# Patient Record
Sex: Female | Born: 1990 | Race: Black or African American | Hispanic: No | Marital: Single | Smoking: Never smoker
Health system: Southern US, Community
[De-identification: ages and names within clinical notes are randomized; demographics above are authoritative.]

## PROBLEM LIST (undated history)

## (undated) DIAGNOSIS — Z789 Other specified health status: Secondary | ICD-10-CM

## (undated) DIAGNOSIS — O139 Gestational [pregnancy-induced] hypertension without significant proteinuria, unspecified trimester: Secondary | ICD-10-CM

## (undated) DIAGNOSIS — R7303 Prediabetes: Secondary | ICD-10-CM

## (undated) DIAGNOSIS — M25569 Pain in unspecified knee: Secondary | ICD-10-CM

## (undated) HISTORY — PX: POLYPECTOMY: SHX149

## (undated) HISTORY — DX: Pain in unspecified knee: M25.569

---

## 2019-04-17 ENCOUNTER — Inpatient Hospital Stay (HOSPITAL_COMMUNITY): Payer: 59

## 2019-04-17 ENCOUNTER — Inpatient Hospital Stay (HOSPITAL_COMMUNITY)
Admission: EM | Admit: 2019-04-17 | Discharge: 2019-04-18 | Disposition: A | Discharge status: Home / Self Care | Payer: 59 | Attending: Obstetrics and Gynecology | Admitting: Obstetrics and Gynecology

## 2019-04-17 ENCOUNTER — Other Ambulatory Visit: Payer: Self-pay

## 2019-04-17 ENCOUNTER — Encounter (HOSPITAL_COMMUNITY): Payer: Self-pay | Admitting: Emergency Medicine

## 2019-04-17 DIAGNOSIS — O209 Hemorrhage in early pregnancy, unspecified: Secondary | ICD-10-CM | POA: Diagnosis present

## 2019-04-17 DIAGNOSIS — O039 Complete or unspecified spontaneous abortion without complication: Secondary | ICD-10-CM | POA: Insufficient documentation

## 2019-04-17 DIAGNOSIS — Z3A01 Less than 8 weeks gestation of pregnancy: Secondary | ICD-10-CM | POA: Diagnosis not present

## 2019-04-17 LAB — POC URINE PREG, ED: Preg Test, Ur: POSITIVE — AB

## 2019-04-17 LAB — ABO/RH: ABO/RH(D): B POS

## 2019-04-17 NOTE — ED Notes (Signed)
Pt is approx [redacted] weeks pregnant, EDD 12/04/2018. Reports vaginal bleeding.

## 2019-04-17 NOTE — ED Notes (Signed)
Pt in POV

## 2019-04-17 NOTE — MAU Provider Note (Signed)
Chief Complaint: Abdominal Pain and Vaginal Bleeding   First Provider Initiated Contact with Patient 04/17/19 2208        SUBJECTIVE HPI: Brandi Wise is a 28 y.o. G2P0010 at [redacted]w[redacted]d by LMP who presents to maternity admissions reporting vaginal bleeding since Friday.  Told nurse she thinks it is a miscarriage. States had one pad halfway saturated, but no clots.  Only a little cramping.  She denies vaginal itching/burning, urinary symptoms, h/a, dizziness, n/v, or fever/chills.   States gets care in [place] because she used to live there.  Had an [place] yesterday and showed me the video on her phone.  There was an IUGS with [redacted]w[redacted]d fetus with a heartbeat.  I cannot verify date or subject for sure but she states it was her baby yesterday. She did not call them tonight "because they are closed".  Has a history of another SAB which never progressed to a fetal heartbeat.    RN Note: Pt states that she thinks she is having a miscarriage.  Pt states she had a positive pregnancy test at home on 03/27/2019.  Pt reports vaginal bleeding that started last Friday with some spotting  History reviewed. No pertinent past medical history. History reviewed. No pertinent surgical history. Social History   Socioeconomic History  . Marital status: Single    Spouse name: Not on file  . Number of children: Not on file  . Years of education: Not on file  . Highest education level: Not on file  Occupational History  . Not on file  Social Needs  . Financial resource strain: Not on file  . Food insecurity:    Worry: Not on file    Inability: Not on file  . Transportation needs:    Medical: Not on file    Non-medical: Not on file  Tobacco Use  . Smoking status: Never Smoker  . Smokeless tobacco: Never Used  Substance and Sexual Activity  . Alcohol use: Never    Frequency: Never  . Drug use: Never  . Sexual activity: Not Currently  Lifestyle  . Physical activity:    Days per week: Not on file     Minutes per session: Not on file  . Stress: Not on file  Relationships  . Social connections:    Talks on phone: Not on file    Gets together: Not on file    Attends religious service: Not on file    Active member of club or organization: Not on file    Attends meetings of clubs or organizations: Not on file    Relationship status: Not on file  . Intimate partner violence:    Fear of current or ex partner: Not on file    Emotionally abused: Not on file    Physically abused: Not on file    Forced sexual activity: Not on file  Other Topics Concern  . Not on file  Social History Narrative  . Not on file   No current facility-administered medications on file prior to encounter.    No current outpatient medications on file prior to encounter.   No Known Allergies  I have reviewed patient's Past Medical Hx, Surgical Hx, Family Hx, Social Hx, medications and allergies.   ROS:  Review of Systems  Constitutional: Negative for chills and fever.  Respiratory: Negative for shortness of breath.   Gastrointestinal: Negative for abdominal pain, constipation, diarrhea and nausea.  Genitourinary: Positive for pelvic pain (mild cramping) and vaginal bleeding (small ).  Review of Systems  Other systems negative   Physical Exam  Physical Exam Patient Vitals for the past 24 hrs:  BP Temp Temp src Pulse Resp SpO2 Height Weight  04/17/19 2157 131/79 98.4 F (36.9 C) Oral 91 16 96 % - -  04/17/19 2153 - - - - - - - 102.7 kg  04/17/19 2102 (!) 137/92 98.6 F (37 C) Oral 97 16 100 % 5\' 5"  (1.651 m) 99.8 kg   Constitutional: Well-developed, well-nourished female in no acute distress.  Cardiovascular: normal rate Respiratory: normal effort GI: Abd soft, non-tender. Pos BS x 4 MS: Extremities nontender, no edema, normal ROM Neurologic: Alert and oriented x 4.  GU: Neg CVAT.  PELVIC EXAM: Cervix pink, visually closed, without lesion, small clotted old blood near cervix, which is closed  and long  Vaginal walls and external genitalia normal Bimanual exam: Cervix 0/long/high, firm, anterior, neg CMT, uterus nontender, nonenlarged, adnexa without tenderness, enlargement, or mass   LAB RESULTS Results for orders placed or performed during the hospital encounter of 04/17/19 (from the past 24 hour(s))  POC Urine Pregnancy, ED (not at [facility])     Status: Abnormal   Collection Time: 04/17/19  9:22 PM  Result Value Ref Range   Preg Test, Ur POSITIVE (A) NEGATIVE  ABO/Rh     Status: None   Collection Time: 04/17/19 10:46 PM  Result Value Ref Range   ABO/RH(D) B POS    No rh immune globuloin      NOT A RH IMMUNE GLOBULIN CANDIDATE, PT RH POSITIVE Performed at [facility], [address], [place], [place] [address]   CBC     Status: Abnormal   Collection Time: 04/18/19 12:24 AM  Result Value Ref Range   WBC 15.9 (H) 4.0 - 10.5 K/uL   RBC 3.97 3.87 - 5.11 MIL/uL   Hemoglobin 11.6 (L) 12.0 - 15.0 g/dL   HCT 34.5 (L) 36.0 - 46.0 %   MCV 86.9 80.0 - 100.0 fL   MCH 29.2 26.0 - 34.0 pg   MCHC 33.6 30.0 - 36.0 g/dL   RDW 13.3 11.5 - 15.5 %   Platelets 381 150 - 400 K/uL   nRBC 0.0 0.0 - 0.2 %   Quantitative HCG:   2,402    IMAGING US Ob Transvaginal  Result Date: 04/17/2019 CLINICAL DATA:  28 y/o  F; vaginal bleeding today. EXAM: TRANSVAGINAL OB ULTRASOUND TECHNIQUE: Transvaginal ultrasound was performed for complete evaluation of the gestation as well as the maternal uterus, adnexal regions, and pelvic cul-de-sac. COMPARISON:  None. FINDINGS: Intrauterine gestational sac: None Yolk sac:  Not Visualized. Embryo:  Not Visualized. Cardiac Activity: Not Visualized. Maternal uterus/adnexae: The endometrium is diffusely heterogeneous and hyperemic on color Doppler. The right ovary measures 3.2 x 2.3 x 2.4 cm and is morphologically normal. The left ovary measures 3.9 x 2.1 x 1.6 cm. There is a small left ovarian corpus luteum. IMPRESSION: No intrauterine pregnancy identified. The  endometrium is heterogeneous and hyperemic. Findings may represent a recent spontaneous abortion or nonvisualization of the pregnancy. Intrauterine pregnancy may not be identified with a beta HCG of less than 3,000. Follow-up beta HCG and possible pelvic ultrasound as indicated is recommended to evaluate pregnancy location. This recommendation follows SRU consensus guidelines: Diagnostic Criteria for Nonviable Pregnancy Early in the First Trimester. [place] Med 2013; 379:0240-97. Electronically Signed   By: Kristine Garbe M.D.   On: 04/17/2019 23:39     MAU Management/MDM: I initially  ordered an Korea since she had a video of her Korea reportedly from yesterday showing a single live IUP at [redacted]w[redacted]d.   Our Korea did not show any signs of a pregnancy  I added on the HCG which is listed above.  Discussed it is unclear as to what happened. The amount of bleeding and pain she describes is not consistent with loss of a 6+ week pregnancy, but if the Korea she showed me is accurate, this appears to represent a loss  I recommended she call her office this morning and tellthem she was here THey should repeat her HCG in 48 hrs or a week to assess for drop She may need a followup US next week She took this news very stoically and did not have any questions Did not appear surprised or upset.  ASSESSMENT Pregnancy at [redacted]w[redacted]d by LMP Apparent completed spontaneous abortion vs early pregnancy of unknownlocation  PLAN Discharge home She prefers to do her follow up at her doctor in [place] repeat HCG level in 48 hours Bleeding precautions  Pt stable at time of discharge. Encouraged to return here or to other Urgent Care/ED if she develops worsening of symptoms, increase in pain, fever, or other concerning symptoms.    Hansel Feinstein CNM, MSN Certified Nurse-Midwife 04/17/2019  10:08 PM

## 2019-04-17 NOTE — MAU Note (Signed)
Pt states that she thinks she is having a miscarriage.   Pt states she had a positive pregnancy test at home on 03/27/2019.   Pt reports vaginal bleeding that started last Friday with some spotting.

## 2019-04-18 DIAGNOSIS — O039 Complete or unspecified spontaneous abortion without complication: Secondary | ICD-10-CM

## 2019-04-18 DIAGNOSIS — Z3A01 Less than 8 weeks gestation of pregnancy: Secondary | ICD-10-CM

## 2019-04-18 DIAGNOSIS — O209 Hemorrhage in early pregnancy, unspecified: Secondary | ICD-10-CM

## 2019-04-18 LAB — CBC
HCT: 34.5 % — ABNORMAL LOW (ref 36.0–46.0)
Hemoglobin: 11.6 g/dL — ABNORMAL LOW (ref 12.0–15.0)
MCH: 29.2 pg (ref 26.0–34.0)
MCHC: 33.6 g/dL (ref 30.0–36.0)
MCV: 86.9 fL (ref 80.0–100.0)
Platelets: 381 10*3/uL (ref 150–400)
RBC: 3.97 MIL/uL (ref 3.87–5.11)
RDW: 13.3 % (ref 11.5–15.5)
WBC: 15.9 10*3/uL — ABNORMAL HIGH (ref 4.0–10.5)
nRBC: 0 % (ref 0.0–0.2)

## 2019-04-18 LAB — HCG, QUANTITATIVE, PREGNANCY: hCG, Beta Chain, Quant, S: 2402 m[IU]/mL — ABNORMAL HIGH (ref <5)

## 2019-04-18 NOTE — Discharge Instructions (Signed)
Vaginal Bleeding During Pregnancy, First Trimester  A small amount of bleeding (spotting) from the vagina is common during early pregnancy. Sometimes the bleeding is normal and does not cause problems. At other times, though, bleeding may be a sign of something serious. Tell your doctor about any bleeding from your vagina right away. Follow these instructions at home: Activity  Follow your doctor's instructions about how active you can be.  If needed, make plans for someone to help with your normal activities.  Do not have sex or orgasms until your doctor says that this is safe. General instructions  Take over-the-counter and prescription medicines only as told by your doctor.  Watch your condition for any changes.  Write down: ? The number of pads you use each day. ? How often you change pads. ? How soaked (saturated) your pads are.  Do not use tampons.  Do not douche.  If you pass any tissue from your vagina, save it to show to your doctor.  Keep all follow-up visits as told by your doctor. This is important. Contact a doctor if:  You have vaginal bleeding at any time while you are pregnant.  You have cramps.  You have a fever. Get help right away if:  You have very bad cramps in your back or belly (abdomen).  You pass large clots or a lot of tissue from your vagina.  Your bleeding gets worse.  You feel light-headed.  You feel weak.  You pass out (faint).  You have chills.  You are leaking fluid from your vagina.  You have a gush of fluid from your vagina. Summary  Sometimes vaginal bleeding during pregnancy is normal and does not cause problems. At other times, bleeding may be a sign of something serious.  Tell your doctor about any bleeding from your vagina right away.  Follow your doctor's instructions about how active you can be. You may need someone to help you with your normal activities. This information is not intended to replace advice given to  you by your health care provider. Make sure you discuss any questions you have with your health care provider. Document Released: 03/31/2014 Document Revised: 02/15/2017 Document Reviewed: 02/15/2017 Elsevier Interactive Patient Education  2019 [employer].   Miscarriage A miscarriage is the loss of an unborn baby (fetus) before the 20th week of pregnancy. Most miscarriages happen during the first 3 months of pregnancy. Sometimes, a miscarriage can happen before a woman knows that she is pregnant. Having a miscarriage can be an emotional experience. If you have had a miscarriage, talk with your health care provider about any questions you may have about miscarrying, the grieving process, and your plans for future pregnancy. What are the causes? A miscarriage may be caused by:  Problems with the genes or chromosomes of the fetus. These problems make it impossible for the baby to develop normally. They are often the result of random errors that occur early in the development of the baby, and are not passed from parent to child (not inherited).  Infection of the cervix or uterus.  Conditions that affect hormone balance in the body.  Problems with the cervix, such as the cervix opening and thinning before pregnancy is at term (cervical insufficiency).  Problems with the uterus. These may include: ? A uterus with an abnormal shape. ? Fibroids in the uterus. ? Congenital abnormalities. These are problems that were present at birth.  Certain medical conditions.  Smoking, drinking alcohol, or using drugs.  Injury (  trauma). In many cases, the cause of a miscarriage is not known. What are the signs or symptoms? Symptoms of this condition include:  Vaginal bleeding or spotting, with or without cramps or pain.  Pain or cramping in the abdomen or lower back.  Passing fluid, tissue, or blood clots from the vagina. How is this diagnosed? This condition may be diagnosed based on:  A  physical exam.  Ultrasound.  Blood tests.  Urine tests. How is this treated? Treatment for a miscarriage is sometimes not necessary if you naturally pass all the tissue that was in your uterus. If necessary, this condition may be treated with:  Dilation and curettage (D&C). This is a procedure in which the cervix is stretched open and the lining of the uterus (endometrium) is scraped. This is done only if tissue from the fetus or placenta remains in the body (incomplete miscarriage).  Medicines, such as: ? Antibiotic medicine, to treat infection. ? Medicine to help the body pass any remaining tissue. ? Medicine to reduce (contract) the size of the uterus. These medicines may be given if you have a lot of bleeding. If you have Rh negative blood and your baby was Rh positive, you will need a shot of a medicine called Rh immunoglobulinto protect your future babies from Rh blood problems. "Rh-negative" and "Rh-positive" refer to whether or not the blood has a specific protein found on the surface of red blood cells (Rh factor). Follow these instructions at home: Medicines   Take over-the-counter and prescription medicines only as told by your health care provider.  If you were prescribed antibiotic medicine, take it as told by your health care provider. Do not stop taking the antibiotic even if you start to feel better.  Do not take NSAIDs, such as aspirin and ibuprofen, unless they are approved by your health care provider. These medicines can cause bleeding. Activity  Rest as directed. Ask your health care provider what activities are safe for you.  Have someone help with home and family responsibilities during this time. General instructions  Keep track of the number of sanitary pads you use each day and how soaked (saturated) they are. Write down this information.  Monitor the amount of tissue or blood clots that you pass from your vagina. Save any large amounts of tissue for your  health care provider to examine.  Do not use tampons, douche, or have sex until your health care provider approves.  To help you and your partner with the process of grieving, talk with your health care provider or seek counseling.  When you are ready, meet with your health care provider to discuss any important steps you should take for your health. Also, discuss steps you should take to have a healthy pregnancy in the future.  Keep all follow-up visits as told by your health care provider. This is important. Where to find more information  The American Congress of Obstetricians and Gynecologists: www.acog.org  U.S. Department of Health and Programmer, systems of Womens Health: [place] Contact a health care provider if:  You have a fever or chills.  You have a foul smelling vaginal discharge.  You have more bleeding instead of less. Get help right away if:  You have severe cramps or pain in your back or abdomen.  You pass blood clots or tissue from your vagina that is walnut-sized or larger.  You soak more than 1 regular sanitary pad in an hour.  You become light-headed or weak.  You  pass out.  You have feelings of sadness that take over your thoughts, or you have thoughts of hurting yourself. Summary  Most miscarriages happen in the first 3 months of pregnancy. Sometimes miscarriage happens before a woman even knows that she is pregnant.  Follow your health care provider's instruction for home care. Keep all follow-up appointments.  To help you and your partner with the process of grieving, talk with your health care provider or seek counseling. This information is not intended to replace advice given to you by your health care provider. Make sure you discuss any questions you have with your health care provider. Document Released: 05/10/2001 Document Revised: 12/20/2016 Document Reviewed: 12/20/2016 Elsevier Interactive Patient Education  2019 [employer].

## 2020-02-02 ENCOUNTER — Ambulatory Visit: Payer: Self-pay | Attending: Internal Medicine

## 2020-02-02 DIAGNOSIS — Z23 Encounter for immunization: Secondary | ICD-10-CM | POA: Insufficient documentation

## 2020-02-02 NOTE — Progress Notes (Signed)
   Covid-19 Vaccination Clinic  Name:  Brandi Wise    MRN: [id number] DOB: Jun 27, 1991  02/02/2020  [name] was observed post Covid-19 immunization for 15 minutes without incident. She was provided with Vaccine Information Sheet and instruction to access the V-Safe system.   [name] was instructed to call 911 with any severe reactions post vaccine: Marland Kitchen Difficulty breathing  . Swelling of face and throat  . A fast heartbeat  . A bad rash all over body  . Dizziness and weakness   Immunizations Administered    Name Date Dose VIS Date Route   Pfizer COVID-19 Vaccine 02/02/2020  5:42 PM 0.3 mL 11/08/2019 Intramuscular   Manufacturer: [place]   Lot: [id number]   [place]: [id number]

## 2020-02-23 ENCOUNTER — Ambulatory Visit: Payer: Self-pay | Attending: Internal Medicine

## 2020-02-23 DIAGNOSIS — Z23 Encounter for immunization: Secondary | ICD-10-CM

## 2020-02-23 NOTE — Progress Notes (Signed)
   Covid-19 Vaccination Clinic  Name:  Brandi Wise    MRN: [id number] DOB: 02/12/1991  02/23/2020  [name] was observed post Covid-19 immunization for 15 minutes without incident. She was provided with Vaccine Information Sheet and instruction to access the V-Safe system.   [name] was instructed to call 911 with any severe reactions post vaccine: Marland Kitchen Difficulty breathing  . Swelling of face and throat  . A fast heartbeat  . A bad rash all over body  . Dizziness and weakness   Immunizations Administered    Name Date Dose VIS Date Route   Pfizer COVID-19 Vaccine 02/23/2020  3:30 PM 0.3 mL 11/08/2019 Intramuscular   Manufacturer: [employer]   Lot: [id number]   [place]: [id number]

## 2020-05-18 ENCOUNTER — Emergency Department (HOSPITAL_COMMUNITY)
Admission: EM | Admit: 2020-05-18 | Discharge: 2020-05-18 | Disposition: A | Discharge status: Home / Self Care | Payer: BC Managed Care – PPO | Attending: Emergency Medicine | Admitting: Emergency Medicine

## 2020-05-18 ENCOUNTER — Encounter (HOSPITAL_COMMUNITY): Payer: Self-pay | Admitting: Emergency Medicine

## 2020-05-18 DIAGNOSIS — Z5321 Procedure and treatment not carried out due to patient leaving prior to being seen by health care provider: Secondary | ICD-10-CM | POA: Diagnosis not present

## 2020-05-18 DIAGNOSIS — H53143 Visual discomfort, bilateral: Secondary | ICD-10-CM | POA: Insufficient documentation

## 2020-05-18 NOTE — ED Triage Notes (Signed)
Pt. Stated, My eyes have been sensitive to light and it seems to get worse. I went to my Dr. And he sent me to an Opthalmologist, but that's in a month.

## 2020-05-20 ENCOUNTER — Encounter: Payer: Self-pay | Admitting: Neurology

## 2020-06-25 ENCOUNTER — Other Ambulatory Visit: Payer: Self-pay

## 2020-06-25 ENCOUNTER — Ambulatory Visit: Payer: BC Managed Care – PPO | Admitting: Neurology

## 2020-06-25 ENCOUNTER — Encounter: Payer: Self-pay | Admitting: Neurology

## 2020-06-25 VITALS — BP 104/75 | HR 96 | Ht 65.0 in | Wt 230.0 lb

## 2020-06-25 DIAGNOSIS — R519 Headache, unspecified: Secondary | ICD-10-CM | POA: Diagnosis not present

## 2020-06-25 DIAGNOSIS — G43009 Migraine without aura, not intractable, without status migrainosus: Secondary | ICD-10-CM | POA: Diagnosis not present

## 2020-06-25 MED ORDER — TOPIRAMATE 50 MG PO TABS: 50.0000 mg | ORAL_TABLET | Freq: Every day | ORAL | 5 refills | Status: DC

## 2020-06-25 MED ORDER — RIZATRIPTAN BENZOATE 10 MG PO TBDP: ORAL_TABLET | ORAL | 5 refills | Status: DC

## 2020-06-25 NOTE — Progress Notes (Signed)
NEUROLOGY CONSULTATION NOTE  Shalah Estelle MRN: [id number] DOB: 09-Apr-1991  Referring provider: Aura Dials, MD Primary care provider: No PCP  Reason for consult:  Headache and dizziness   HISTORY OF PRESENT ILLNESS: Brandi Wise is a 29 year old right-handed female who presents for headache and dizziness.  History supplemented by ophthalmology and referring provider notes.  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  In June, she was in a room with a flashing light which triggered a headache.  She experiences severe pressure across the forehead with bilateral eye pressure associated with nausea, photophobia, phonophobia, and osmophobia, sometimes dizziness, rarely vomiting but no visual disturbance, weakness, numbness or slurred speech.  They typically last as long as she is exposed to the offending light.  Otherwise, if she removes herself from the trigger and into a dark room, it lasts about 2 hours.  They occurs every time she is exposed to an offending light for any prolonged period of time.  They occur at least once a week (only at work).    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   She had occasional headaches as a child but nothing like this. . Current NSAIDS:  none Current analgesics:  none Current triptans:  none Current ergotamine:  none Current anti-emetic:  Zofran 8mg  Current muscle relaxants:  none Current anti-anxiolytic:  none Current sleep aide:  none Current Antihypertensive medications:  none Current Antidepressant medications:  none Current Anticonvulsant medications:  none Current anti-CGRP:  none Current Vitamins/Herbal/Supplements:  none Current Antihistamines/Decongestants:  none Other therapy:  none Hormone/birth control:   none  Past NSAIDS:  Ibuprofen Past analgesics:  acetaminophen Past abortive triptans:  Sumatriptan 50mg  (made headache worse) Past abortive ergotamine:  none Past muscle relaxants:  none Past anti-emetic:  none Past antihypertensive medications:  none Past antidepressant medications:  none Past anticonvulsant medications:  none Past anti-CGRP:  none Past vitamins/Herbal/Supplements:  none Past antihistamines/decongestants:  none Other past therapies:  none  Caffeine:  No coffee.  No other beverages with caffeine Diet:  Over 60 oz water daily.  May skip meals (breakfast and sometimes lunch if busy day). Exercise:  Cross fit Depression:  none; Anxiety:  none Other pain:  no Sleep hygiene:  Good. Family history of headache:  Unknown (adopted)   PAST MEDICAL HISTORY: History reviewed. No pertinent past medical history.  PAST SURGICAL HISTORY: History reviewed. No pertinent surgical history.   MEDICATIONS: Zofran  ALLERGIES: No Known Allergies  FAMILY HISTORY: Adopted  SOCIAL HISTORY: Social History   Socioeconomic History  . Marital status: Single    Spouse name: Not on file  . Number of children: Not on file  . Years of education: Not on file  . Highest education level: Not on file  Occupational History  . Not on file  Tobacco Use  . Smoking status: Never Smoker  . Smokeless tobacco: Never Used  Substance and Sexual Activity  . Alcohol use: Yes  . Drug use: Never  . Sexual activity: Not Currently  Other Topics Concern  . Not on file  Social History Narrative  . Not on file   Social Determinants of Health   Financial Resource Strain:   . Difficulty of Paying Living Expenses:   Food Insecurity:   . Worried About Running  Out of Food in the Last Year:   . [place] in the Last Year:   Transportation Needs:   . Lack of Transportation (Medical):   Marland Kitchen Lack of Transportation (Non-Medical):   Physical Activity:   . Days of Exercise per Week:   .  Minutes of Exercise per Session:   Stress:   . Feeling of Stress :   Social Connections:   . Frequency of Communication with Friends and Family:   . Frequency of Social Gatherings with Friends and Family:   . Attends Religious Services:   . Active Member of Clubs or Organizations:   . Attends Archivist Meetings:   Marland Kitchen Marital Status:   Intimate Partner Violence:   . Fear of Current or Ex-Partner:   . Emotionally Abused:   Marland Kitchen Physically Abused:   . Sexually Abused:     PHYSICAL EXAM: Blood pressure 104/75, pulse 96, height 5\' 5"  (1.651 m), weight (!) 230 lb (104.3 kg), SpO2 99 %. General: No acute distress.  Patient appears well-groomed.  Head:  Normocephalic/atraumatic Eyes:  fundi examined but not visualized Neck: supple, no paraspinal tenderness, full range of motion Back: No paraspinal tenderness Heart: regular rate and rhythm Lungs: Clear to auscultation bilaterally. Vascular: No carotid bruits. Neurological Exam: Mental status: alert and oriented to person, place, and time, recent and remote memory intact, fund of knowledge intact, attention and concentration intact, speech fluent and not dysarthric, language intact. Cranial nerves: CN I: not tested CN II: pupils equal, round and reactive to light, visual fields intact CN III, IV, VI:  full range of motion, no nystagmus, no ptosis CN V: facial sensation intact CN VII: upper and lower face symmetric CN VIII: hearing intact CN IX, X: gag intact, uvula midline CN XI: sternocleidomastoid and trapezius muscles intact CN XII: tongue midline Bulk & Tone: normal, no fasciculations. Motor:  5/5 throughout  Sensation:  Pinprick and vibration sensation intact. Deep Tendon Reflexes:  2+ throughout, toes downgoing.  Finger to nose testing:  Without dysmetria.  Heel to shin:  Without dysmetria.  Gait:  Normal station and stride.  Able to turn and tandem walk. Romberg negative.  IMPRESSION: New onset headaches, likely  migraine without aura.  However, given the frequency that has persisted for the past 6 months, would evaluate for secondary intracranial etiology.  PLAN: 1. MRI of brain with and without contrast  2. For preventative management, topiramate 25mg  at bedtime for a week, then 50mg  at bedtime.  We can increase to 100mg  at bedtime in 5 weeks if needed. 3.  For abortive therapy, rizatriptan 10mg  with Zofran 4.  Limit use of pain relievers to no more than 2 days out of week to prevent risk of rebound or medication-overuse headache. 5.  Keep headache diary 6.  Exercise, hydration, caffeine cessation, sleep hygiene, monitor for and avoid triggers 7.  Follow up 6 months   Thank you for allowing me to take part in the care of this patient.  Metta Clines, DO

## 2020-06-25 NOTE — Patient Instructions (Signed)
  1. Start topiramate 50mg  tablet.  Take 1/2 tablet at bedtime for one week, then 1 tablet at bedtime.  If headaches not improved in 5 weeks, contact me and I will increase dose. 2. Take rizatriptan 10mg  at earliest onset of headache.  May repeat dose once in 2 hours if needed.  Maximum 2 tablets in 24 hours. 3. Take Zofran for nausea 4. Limit use of pain relievers to no more than 2 days out of the week.  These medications include acetaminophen, NSAIDs (ibuprofen/Advil/Motrin, naproxen/Aleve, triptans (Imitrex/sumatriptan), Excedrin, and narcotics.  This will help reduce risk of rebound headaches. 5. Be aware of common food triggers:  - Caffeine:  coffee, black tea, cola, Mt. Dew  - Chocolate  - Dairy:  aged cheeses (brie, blue, cheddar, gouda, [place], provolone, [place], Swiss, etc), chocolate milk, buttermilk, sour cream, limit eggs and yogurt  - Nuts, peanut butter  - Alcohol  - Cereals/grains:  FRESH breads (fresh bagels, sourdough, doughnuts), yeast productions  - Processed/canned/aged/cured meats (pre-packaged deli meats, hotdogs)  - MSG/glutamate:  soy sauce, flavor enhancer, pickled/preserved/marinated foods  - Sweeteners:  aspartame (Equal, Nutrasweet).  Sugar and Splenda are okay  - Vegetables:  legumes (lima beans, lentils, snow peas, fava beans, pinto peans, peas, garbanzo beans), sauerkraut, onions, olives, pickles  - Fruit:  avocados, bananas, citrus fruit (orange, lemon, grapefruit), mango  - Other:  Frozen meals, macaroni and cheese 6. Routine exercise 7. Stay adequately hydrated (aim for 64 oz water daily) 8. Keep headache diary 9. Maintain proper stress management 10. Maintain proper sleep hygiene 11. Do not skip meals 12. Consider supplements:  magnesium citrate 400mg  daily, riboflavin 400mg  daily, coenzyme Q10 100mg  three times daily. 13. Will check MRI of brain with and without

## 2020-07-21 ENCOUNTER — Other Ambulatory Visit: Payer: Self-pay

## 2020-07-21 ENCOUNTER — Ambulatory Visit
Admission: RE | Admit: 2020-07-21 | Discharge: 2020-07-21 | Disposition: A | Discharge status: Home / Self Care | Payer: BC Managed Care – PPO | Source: Ambulatory Visit | Attending: Neurology | Admitting: Neurology

## 2020-07-21 DIAGNOSIS — G43009 Migraine without aura, not intractable, without status migrainosus: Secondary | ICD-10-CM

## 2020-07-21 DIAGNOSIS — R519 Headache, unspecified: Secondary | ICD-10-CM

## 2020-07-21 MED ORDER — GADOBENATE DIMEGLUMINE 529 MG/ML IV SOLN
20.0000 mL | Freq: Once | INTRAVENOUS | Status: AC | PRN
  Administered 2020-07-21: 20 mL via INTRAVENOUS

## 2020-07-23 ENCOUNTER — Telehealth: Payer: Self-pay

## 2020-07-23 NOTE — Telephone Encounter (Signed)
-----   Message from Pieter Partridge, DO sent at 07/22/2020  7:45 AM EDT ----- MRI of brain is normal

## 2020-07-23 NOTE — Telephone Encounter (Signed)
Called patient and left message for a call back.  

## 2020-07-24 NOTE — Telephone Encounter (Signed)
Called patient and informed her of results. Patient verbalized an understanding

## 2020-07-24 NOTE — Telephone Encounter (Signed)
Patient left a message returning Brandi Wise's call.

## 2020-12-25 ENCOUNTER — Other Ambulatory Visit: Payer: Self-pay

## 2020-12-25 ENCOUNTER — Ambulatory Visit (INDEPENDENT_AMBULATORY_CARE_PROVIDER_SITE_OTHER): Payer: BC Managed Care – PPO | Admitting: Family Medicine

## 2020-12-25 ENCOUNTER — Encounter (INDEPENDENT_AMBULATORY_CARE_PROVIDER_SITE_OTHER): Payer: Self-pay | Admitting: Family Medicine

## 2020-12-25 VITALS — BP 106/68 | HR 67 | Temp 98.0°F | Ht 65.0 in | Wt 223.0 lb

## 2020-12-25 DIAGNOSIS — Z0289 Encounter for other administrative examinations: Secondary | ICD-10-CM

## 2020-12-25 DIAGNOSIS — Z9189 Other specified personal risk factors, not elsewhere classified: Secondary | ICD-10-CM | POA: Diagnosis not present

## 2020-12-25 DIAGNOSIS — R5383 Other fatigue: Secondary | ICD-10-CM | POA: Diagnosis not present

## 2020-12-25 DIAGNOSIS — Z1331 Encounter for screening for depression: Secondary | ICD-10-CM

## 2020-12-25 DIAGNOSIS — R0602 Shortness of breath: Secondary | ICD-10-CM | POA: Diagnosis not present

## 2020-12-25 DIAGNOSIS — E66812 Obesity, class 2: Secondary | ICD-10-CM

## 2020-12-25 DIAGNOSIS — Z6837 Body mass index (BMI) 37.0-37.9, adult: Secondary | ICD-10-CM

## 2020-12-28 NOTE — Progress Notes (Signed)
Chief Complaint:   OBESITY Brandi Wise (MR# [id number]) is a 30 y.o. female who presents for evaluation and treatment of obesity and related comorbidities. Current BMI is Body mass index is 37.11 kg/m. Brandi Wise has been struggling with her weight for many years and has been unsuccessful in either losing weight, maintaining weight loss, or reaching her healthy weight goal.  Brandi Wise heard about clinic from co workers. She works as a Runner, broadcasting/film/video. She did weight Watchers in 2012. Skipping breakfast and lunch 2-3 times per week due to schedule. Brandi Wise will do a protein shake Prestige or Premier and [place] yogurt (just trying to eat). Lunch is chicken (6 oz), rice (1 cup) and broccoli (feel satisfied) if eating late lunch will not eat rest of day. For dinner, grilled chicken and baked potato or double cheeseburger with large fry (feel full).   Brandi Wise is currently in the action stage of change and ready to dedicate time achieving and maintaining a healthier weight. Brandi Wise is interested in becoming our patient and working on intensive lifestyle modifications including (but not limited to) diet and exercise for weight loss.  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  Depression Screen Brandi Wise's Food and Mood (modified PHQ-9) score was 9.  Depression screen Brand[facility] 2/9 12/25/2020  Decreased Interest 3  Down, Depressed, Hopeless 1  PHQ - 2 Score 4  Altered  sleeping 0  Tired, decreased energy 1  Change in appetite 1  Feeling bad or failure about yourself  1  Trouble concentrating 2  Moving slowly or fidgety/restless 0  Suicidal thoughts 0  PHQ-9 Score 9  Difficult doing work/chores Somewhat difficult   Subjective:   Other fatigue  Brandi Wise admits to daytime somnolence and admits to waking up still tired. Patent has a history of symptoms of daytime fatigue and morning fatigue. Brandi Wise generally gets 7 hours of sleep per night, and states that she has difficulty falling asleep. Snoring is present. Apneic episodes is not present. Epworth Sleepiness Score is 11.  EKG-NSR at 65 bpm.  SOB (shortness of breath) on exertion  Brandi Wise notes increasing shortness of breath with exercising and seems to be worsening over time with weight gain. She notes getting out of breath sooner with activity than she used to. This has not gotten worse recently. Brandi Wise denies shortness of breath at rest or orthopnea.  At risk for malnutrition Brandi Wise is at increased risk for malnutrition due to often skipping at least one meal daily.   Assessment/Plan:   1. Other fatigue Brandi Wise does not feel that her weight is causing her energy to be lower than it should be. Fatigue may be related to obesity, depression or many other causes. Labs will be ordered, and in the meanwhile, Brandi Wise will focus on self care including making healthy food choices, increasing physical activity and focusing on stress reduction. - EKG 12-Lead - Vitamin B12 - Comprehensive metabolic panel - Folate - Hemoglobin A1c - Insulin, random - T3 - T4 -  TSH - VITAMIN D 25 Hydroxy (Vit-D Deficiency, Fractures)  2. SOB (shortness of breath) on exertion Brandi Wise does not feel that she gets out of breath more easily that she used to when she exercises. Brandi Wise's shortness of breath appears to be obesity related and exercise induced. She has agreed to work on weight loss and gradually increase  exercise to treat her exercise induced shortness of breath. Will continue to monitor closely.   - CBC with Differential/Platelet - Lipid Panel With LDL/HDL Ratio  3. Depression screening Behavior modification techniques were discussed today to help Brandi Wise deal with her emotional/non-hunger eating behaviors.  Orders and follow up as documented in patient record.   4. At risk for malnutrition Brandi Wise was given approximately 15 minutes of counseling today regarding prevention of malnutrition and ways to meet macronutrient goals.  5. Class 2 severe obesity with serious comorbidity and body mass index (BMI) of 37.0 to 37.9 in adult, unspecified obesity type (HCC)  Brandi Wise is currently in the action stage of change and her goal is to continue with weight loss efforts. I recommend Brandi Wise begin the structured treatment plan as follows:  She has agreed to the Category 4 Plan.  Exercise goals: No exercise has been prescribed at this time.   Behavioral modification strategies: increasing lean protein intake, meal planning and cooking strategies, keeping healthy foods in the home and planning for success.  She was informed of the importance of frequent follow-up visits to maximize her success with intensive lifestyle modifications for her multiple health conditions. She was informed we would discuss her lab results at her next visit unless there is a critical issue that needs to be addressed sooner. Brandi Wise agreed to keep her next visit at the agreed upon time to discuss these results.  Objective:   Blood pressure 106/68, pulse 67, temperature 98 F (36.7 C), temperature source Oral, height 5\' 5"  (1.651 m), weight 223 lb (101.2 kg), last menstrual period 12/11/2020, SpO2 98 %, unknown if currently breastfeeding. Body mass index is 37.11 kg/m.  EKG: Normal sinus rhythm, rate 65 bpm.  Indirect Calorimeter completed today shows a VO2 of 354 and a REE of 2467.  Her calculated basal metabolic rate is  3295 thus her basal metabolic rate is better than expected.  General: Cooperative, alert, well developed, in no acute distress. HEENT: Conjunctivae and lids unremarkable. Cardiovascular: Regular rhythm.  Lungs: Normal work of breathing. Neurologic: No focal deficits.   No results found for: CREATININE, BUN, NA, K, CL, CO2 No results found for: ALT, AST, GGT, ALKPHOS, BILITOT No results found for: HGBA1C No results found for: INSULIN No results found for: TSH No results found for: CHOL, HDL, LDLCALC, LDLDIRECT, TRIG, CHOLHDL Lab Results  Component Value Date   WBC 15.9 (H) 04/18/2019   HGB 11.6 (L) 04/18/2019   HCT 34.5 (L) 04/18/2019   MCV 86.9 04/18/2019   PLT 381 04/18/2019   No results found for: IRON, TIBC, FERRITIN   Attestation Statements:   Reviewed by clinician on day of visit: allergies, medications, problem list, medical history, surgical history, family history, social history, and previous encounter notes.  This is the patient's first visit at [facility]. The patient's NEW PATIENT PACKET was reviewed at length. Included in the packet: current and past health history, medications, allergies, ROS, gynecologic history (women only), surgical history, family history, social history, weight history, weight loss surgery history (for those that have had weight loss surgery), nutritional evaluation, mood and food questionnaire, PHQ9, Epworth  questionnaire, sleep habits questionnaire, patient life and health improvement goals questionnaire. These will all be scanned into the patient's chart under media.   During the visit, I independently reviewed the patient's EKG, bioimpedance scale results, and indirect calorimeter results. I used this information to tailor a meal plan for the patient that will help her to lose weight and will improve her obesity-related conditions going forward. I performed a medically necessary appropriate examination and/or evaluation. I  discussed the assessment and treatment plan with the patient. The patient was provided an opportunity to ask questions and all were answered. The patient agreed with the plan and demonstrated an understanding of the instructions. Labs were ordered at this visit and will be reviewed at the next visit unless more critical results need to be addressed immediately. Clinical information was updated and documented in the EMR.   Time spent on visit including pre-visit chart review and post-visit care was 45 minutes.   A separate 15 minutes was spent on risk counseling (see above).    I, Para March, am acting as transcriptionist for Coralie Common, MD. I have reviewed the above documentation for accuracy and completeness, and I agree with the above. - Jinny Blossom, MD

## 2020-12-29 ENCOUNTER — Ambulatory Visit: Payer: BC Managed Care – PPO | Admitting: Neurology

## 2020-12-29 LAB — COMPREHENSIVE METABOLIC PANEL
ALT: 12 IU/L (ref 0–32)
AST: 15 IU/L (ref 0–40)
Albumin/Globulin Ratio: 1 — ABNORMAL LOW (ref 1.2–2.2)
Albumin: 3.9 g/dL (ref 3.8–4.8)
Alkaline Phosphatase: 84 IU/L (ref 44–121)
BUN/Creatinine Ratio: 12 (ref 9–23)
BUN: 9 mg/dL (ref 6–20)
Bilirubin Total: 0.2 mg/dL (ref 0.0–1.2)
CO2: 23 mmol/L (ref 20–29)
Calcium: 9.2 mg/dL (ref 8.7–10.2)
Chloride: 105 mmol/L (ref 96–106)
Creatinine, Ser: 0.74 mg/dL (ref 0.57–1.00)
GFR calc Af Amer: 124 mL/min/{1.73_m2} (ref >59)
GFR calc non Af Amer: 108 mL/min/{1.73_m2} (ref >59)
Globulin, Total: 3.8 g/dL (ref 1.5–4.5)
Glucose: 105 mg/dL — ABNORMAL HIGH (ref 65–99)
Potassium: 4.3 mmol/L (ref 3.5–5.2)
Sodium: 138 mmol/L (ref 134–144)
Total Protein: 7.7 g/dL (ref 6.0–8.5)

## 2020-12-29 LAB — CBC WITH DIFFERENTIAL/PLATELET

## 2020-12-29 LAB — VITAMIN D 25 HYDROXY (VIT D DEFICIENCY, FRACTURES): Vit D, 25-Hydroxy: 17.9 ng/mL — ABNORMAL LOW (ref 30.0–100.0)

## 2020-12-29 LAB — VITAMIN B12: Vitamin B-12: 732 pg/mL (ref 232–1245)

## 2020-12-29 LAB — TSH: TSH: 0.567 u[IU]/mL (ref 0.450–4.500)

## 2020-12-29 LAB — LIPID PANEL WITH LDL/HDL RATIO
Cholesterol, Total: 160 mg/dL (ref 100–199)
HDL: 54 mg/dL (ref >39)
LDL Chol Calc (NIH): 97 mg/dL (ref 0–99)
LDL/HDL Ratio: 1.8 ratio (ref 0.0–3.2)
Triglycerides: 39 mg/dL (ref 0–149)
VLDL Cholesterol Cal: 9 mg/dL (ref 5–40)

## 2020-12-29 LAB — INSULIN, RANDOM: INSULIN: 34.2 u[IU]/mL — ABNORMAL HIGH (ref 2.6–24.9)

## 2020-12-29 LAB — T3: T3, Total: 84 ng/dL (ref 71–180)

## 2020-12-29 LAB — HEMOGLOBIN A1C
Est. average glucose Bld gHb Est-mCnc: 117 mg/dL
Hgb A1c MFr Bld: 5.7 % — ABNORMAL HIGH (ref 4.8–5.6)

## 2020-12-29 LAB — T4: T4, Total: 6.4 ug/dL (ref 4.5–12.0)

## 2020-12-29 LAB — FOLATE: Folate: 4.2 ng/mL (ref >3.0)

## 2020-12-30 ENCOUNTER — Other Ambulatory Visit (INDEPENDENT_AMBULATORY_CARE_PROVIDER_SITE_OTHER): Payer: Self-pay

## 2020-12-30 DIAGNOSIS — R5383 Other fatigue: Secondary | ICD-10-CM

## 2020-12-30 DIAGNOSIS — R0602 Shortness of breath: Secondary | ICD-10-CM

## 2021-01-07 ENCOUNTER — Encounter (INDEPENDENT_AMBULATORY_CARE_PROVIDER_SITE_OTHER): Payer: Self-pay | Admitting: Family Medicine

## 2021-01-07 ENCOUNTER — Other Ambulatory Visit: Payer: Self-pay

## 2021-01-07 ENCOUNTER — Ambulatory Visit (INDEPENDENT_AMBULATORY_CARE_PROVIDER_SITE_OTHER): Payer: BC Managed Care – PPO | Admitting: Family Medicine

## 2021-01-07 VITALS — BP 114/79 | HR 78 | Temp 97.9°F | Ht 65.0 in | Wt 223.0 lb

## 2021-01-07 DIAGNOSIS — Z6837 Body mass index (BMI) 37.0-37.9, adult: Secondary | ICD-10-CM

## 2021-01-07 DIAGNOSIS — Z9189 Other specified personal risk factors, not elsewhere classified: Secondary | ICD-10-CM

## 2021-01-07 DIAGNOSIS — E559 Vitamin D deficiency, unspecified: Secondary | ICD-10-CM | POA: Diagnosis not present

## 2021-01-07 DIAGNOSIS — R7303 Prediabetes: Secondary | ICD-10-CM

## 2021-01-07 MED ORDER — VITAMIN D (ERGOCALCIFEROL) 1.25 MG (50000 UNIT) PO CAPS: 50000.0000 [IU] | ORAL_CAPSULE | ORAL | 0 refills | Status: DC

## 2021-01-07 MED ORDER — METFORMIN HCL 500 MG PO TABS: 500.0000 mg | ORAL_TABLET | Freq: Every day | ORAL | 0 refills | Status: DC

## 2021-01-08 ENCOUNTER — Other Ambulatory Visit: Payer: Self-pay | Admitting: Otolaryngology

## 2021-01-08 DIAGNOSIS — E079 Disorder of thyroid, unspecified: Secondary | ICD-10-CM

## 2021-01-11 NOTE — Progress Notes (Signed)
Chief Complaint:   OBESITY Brandi Wise is here to discuss her progress with her obesity treatment plan along with follow-up of her obesity related diagnoses. Brandi Wise is on the Category 4 Plan and states she is following her eating plan approximately 70% of the time. Brandi Wise states she is doing crossfit 60 minutes 2 times per week.  Today's visit was #: 2 Starting weight: 223 lbs Starting date: 12/25/2020 Today's weight: 223 lbs Today's date: 01/07/2021 Total lbs lost to date: 0 Total lbs lost since last in-office visit: 0  Interim History: Pt voices she really couldn't get all food in daily. She mentions she was working on getting all food in but often could only get in 4-6 oz at dinner and would sometimes miss out on snacks.  Subjective:   1. Vitamin D deficiency New. Discussed labs with patient today. Pt is not on a Vit D supplement. She reports fatigue.  2. Pre-diabetes New. Discussed labs with patient today. Pt's A1c 5.7 and insulin level 34.2. She is not on medication. She denies cravings during the past 2 weeks secondary to quantity of food.  3. At risk for diabetes mellitus Brandi Wise is at higher than average risk for developing diabetes due to obesity.   Assessment/Plan:   1. Vitamin D deficiency Low Vitamin D level contributes to fatigue and are associated with obesity, breast, and colon cancer. She agrees to start to take prescription Vitamin D @50 ,000 IU every week and will follow-up for routine testing of Vitamin D, at least 2-3 times per year to avoid over-replacement.  2. Pre-diabetes Maddux will continue to work on weight loss, exercise, and decreasing simple carbohydrates to help decrease the risk of diabetes. Start Metformin 500 mg, as per below.  - metFORMIN (GLUCOPHAGE) 500 MG tablet; Take 1 tablet (500 mg total) by mouth daily with breakfast.  Dispense: 30 tablet; Refill: 0  3. At risk for diabetes mellitus Brandi Wise was given approximately 30 minutes of  diabetes education and counseling today. We discussed intensive lifestyle modifications today with an emphasis on weight loss as well as increasing exercise and decreasing simple carbohydrates in her diet. We also reviewed medication options with an emphasis on risk versus benefit of those discussed.   Repetitive spaced learning was employed today to elicit superior memory formation and behavioral change.  4. Class 2 severe obesity with serious comorbidity and body mass index (BMI) of 37.0 to 37.9 in adult, unspecified obesity type (HCC) Brandi Wise is currently in the action stage of change. As such, her goal is to continue with weight loss efforts. She has agreed to the Category 4 Plan.   Exercise goals: As is  Behavioral modification strategies: increasing lean protein intake, meal planning and cooking strategies, keeping healthy foods in the home and planning for success.  Brandi Wise has agreed to follow-up with our clinic in 2 weeks. She was informed of the importance of frequent follow-up visits to maximize her success with intensive lifestyle modifications for her multiple health conditions.   Objective:   Blood pressure 114/79, pulse 78, temperature 97.9 F (36.6 C), temperature source Oral, height 5\' 5"  (1.651 m), weight 223 lb (101.2 kg), last menstrual period 12/11/2020, SpO2 100 %, unknown if currently breastfeeding. Body mass index is 37.11 kg/m.  General: Cooperative, alert, well developed, in no acute distress. HEENT: Conjunctivae and lids unremarkable. Cardiovascular: Regular rhythm.  Lungs: Normal work of breathing. Neurologic: No focal deficits.   Lab Results  Component Value Date   CREATININE 0.74 12/25/2020  BUN 9 12/25/2020   NA 138 12/25/2020   K 4.3 12/25/2020   CL 105 12/25/2020   CO2 23 12/25/2020   Lab Results  Component Value Date   ALT 12 12/25/2020   AST 15 12/25/2020   ALKPHOS 84 12/25/2020   BILITOT 0.2 12/25/2020   Lab Results  Component Value Date    HGBA1C 5.7 (H) 12/25/2020   Lab Results  Component Value Date   INSULIN 34.2 (H) 12/25/2020   Lab Results  Component Value Date   TSH 0.567 12/25/2020   Lab Results  Component Value Date   CHOL 160 12/25/2020   HDL 54 12/25/2020   LDLCALC 97 12/25/2020   TRIG 39 12/25/2020   Lab Results  Component Value Date   WBC CANCELED 12/25/2020   HGB 11.6 (L) 04/18/2019   HCT 34.5 (L) 04/18/2019   MCV 86.9 04/18/2019   PLT 381 04/18/2019    Attestation Statements:   Reviewed by clinician on day of visit: allergies, medications, problem list, medical history, surgical history, family history, social history, and previous encounter notes.  Coral Ceo, am acting as transcriptionist for Coralie Common, MD.   I have reviewed the above documentation for accuracy and completeness, and I agree with the above. - Jinny Blossom, MD

## 2021-01-19 ENCOUNTER — Ambulatory Visit
Admission: RE | Admit: 2021-01-19 | Discharge: 2021-01-19 | Disposition: A | Discharge status: Home / Self Care | Payer: BC Managed Care – PPO | Source: Ambulatory Visit | Attending: Otolaryngology | Admitting: Otolaryngology

## 2021-01-19 DIAGNOSIS — E079 Disorder of thyroid, unspecified: Secondary | ICD-10-CM

## 2021-01-25 ENCOUNTER — Other Ambulatory Visit: Payer: Self-pay | Admitting: Otolaryngology

## 2021-01-25 ENCOUNTER — Encounter (INDEPENDENT_AMBULATORY_CARE_PROVIDER_SITE_OTHER): Payer: Self-pay | Admitting: Adult Health

## 2021-01-25 ENCOUNTER — Other Ambulatory Visit: Payer: Self-pay

## 2021-01-25 ENCOUNTER — Ambulatory Visit (INDEPENDENT_AMBULATORY_CARE_PROVIDER_SITE_OTHER): Payer: BC Managed Care – PPO | Admitting: Adult Health

## 2021-01-25 VITALS — BP 130/78 | HR 85 | Temp 98.4°F | Ht 65.0 in | Wt 229.0 lb

## 2021-01-25 DIAGNOSIS — R7303 Prediabetes: Secondary | ICD-10-CM | POA: Diagnosis not present

## 2021-01-25 DIAGNOSIS — E559 Vitamin D deficiency, unspecified: Secondary | ICD-10-CM | POA: Diagnosis not present

## 2021-01-25 DIAGNOSIS — E041 Nontoxic single thyroid nodule: Secondary | ICD-10-CM

## 2021-01-25 DIAGNOSIS — Z9189 Other specified personal risk factors, not elsewhere classified: Secondary | ICD-10-CM

## 2021-01-25 DIAGNOSIS — Z6838 Body mass index (BMI) 38.0-38.9, adult: Secondary | ICD-10-CM

## 2021-01-25 MED ORDER — VITAMIN D (ERGOCALCIFEROL) 1.25 MG (50000 UNIT) PO CAPS: 50000.0000 [IU] | ORAL_CAPSULE | ORAL | 0 refills | Status: DC

## 2021-01-25 MED ORDER — METFORMIN HCL 500 MG PO TABS: 500.0000 mg | ORAL_TABLET | Freq: Every day | ORAL | 0 refills | Status: DC

## 2021-01-26 NOTE — Progress Notes (Signed)
Chief Complaint:   OBESITY Brandi Wise is here to discuss her progress with her obesity treatment plan along with follow-up of her obesity related diagnoses. Brandi Wise is on the Category 4 Plan and states she is following her eating plan approximately 50% of the time. Brandi Wise states she is doing crossfit 60 minutes 4 times per week.  Today's visit was #: 3 Starting weight: 223 lbs Starting date: 12/25/2020 Today's weight: 229 lbs Today's date: 01/25/2021 Total lbs lost to date: 0 Total lbs lost since last in-office visit: 0  Interim History: Brandi Wise has been experiencing increased emotional eating the last 2 weeks- I.e: Drive thru's at [employer]. She continues to enjoy regular crossfit workouts.  Ultimate goal: lose and maintain weight loss.  Subjective:   1. Vitamin D deficiency Brandi Wise's Vitamin D level was 17.9 on 12/25/2020. She is currently taking prescription vitamin D 50,000 IU each week. She denies nausea, vomiting or muscle weakness.   Ref. Range 12/25/2020 08:42  Vitamin D, 25-Hydroxy Latest Ref Range: 30.0 - 100.0 ng/mL 17.9 (L)   2. Pre-diabetes Brandi Wise was started on Metformin 500 mg with breakfast on 01/07/2021. She denies GI upset. She is experiencing increased emotional eating.  Lab Results  Component Value Date   HGBA1C 5.7 (H) 12/25/2020   Lab Results  Component Value Date   INSULIN 34.2 (H) 12/25/2020    3. At risk for osteoporosis Brandi Wise is at higher risk of osteopenia and osteoporosis due to Vitamin D deficiency, and obesity.  Assessment/Plan:   1. Vitamin D deficiency Low Vitamin D level contributes to fatigue and are associated with obesity, breast, and colon cancer. She agrees to continue to take prescription Vitamin D @50 ,000 IU every week and will follow-up for routine testing of Vitamin D, at least 2-3 times per year to avoid over-replacement.  - Vitamin D, Ergocalciferol, (DRISDOL) 1.25 MG (50000 UNIT) CAPS capsule; Take 1 capsule  (50,000 Units total) by mouth every 7 (seven) days.  Dispense: 4 capsule; Refill: 0  2. Pre-diabetes Brandi Wise will continue to work on weight loss, exercise, and decreasing simple carbohydrates to help decrease the risk of diabetes.   - metFORMIN (GLUCOPHAGE) 500 MG tablet; Take 1 tablet (500 mg total) by mouth daily with breakfast.  Dispense: 30 tablet; Refill: 0  3. At risk for osteoporosis Brandi Wise was given approximately 15 minutes of osteoporosis prevention counseling today. Brandi Wise is at risk for osteopenia and osteoporosis due to her Vitamin D deficiency. She was encouraged to take her Vitamin D and follow her higher calcium diet and increase strengthening exercise to help strengthen her bones and decrease her risk of osteopenia and osteoporosis.  Repetitive spaced learning was employed today to elicit superior memory formation and behavioral change.  4. Class 2 severe obesity with serious comorbidity and body mass index (BMI) of 38.0 to 38.9 in adult, unspecified obesity type (HCC) Brandi Wise is currently in the action stage of change. As such, her goal is to continue with weight loss efforts. She has agreed to the Category 4 Plan.   Exercise goals: As is  Behavioral modification strategies: increasing lean protein intake, meal planning and cooking strategies and planning for success.  Harrison has agreed to follow-up with our clinic in 2 weeks. She was informed of the importance of frequent follow-up visits to maximize her success with intensive lifestyle modifications for her multiple health conditions.   Objective:   Blood pressure 130/78, pulse 85, temperature 98.4 F (36.9 C), height 5\' 5"  (1.651 m),  weight 229 lb (103.9 kg), SpO2 99 %, unknown if currently breastfeeding. Body mass index is 38.11 kg/m.  General: Cooperative, alert, well developed, in no acute distress. HEENT: Conjunctivae and lids unremarkable. Cardiovascular: Regular rhythm.  Lungs: Normal work of  breathing. Neurologic: No focal deficits.   Lab Results  Component Value Date   CREATININE 0.74 12/25/2020   BUN 9 12/25/2020   NA 138 12/25/2020   K 4.3 12/25/2020   CL 105 12/25/2020   CO2 23 12/25/2020   Lab Results  Component Value Date   ALT 12 12/25/2020   AST 15 12/25/2020   ALKPHOS 84 12/25/2020   BILITOT 0.2 12/25/2020   Lab Results  Component Value Date   HGBA1C 5.7 (H) 12/25/2020   Lab Results  Component Value Date   INSULIN 34.2 (H) 12/25/2020   Lab Results  Component Value Date   TSH 0.567 12/25/2020   Lab Results  Component Value Date   CHOL 160 12/25/2020   HDL 54 12/25/2020   LDLCALC 97 12/25/2020   TRIG 39 12/25/2020   Lab Results  Component Value Date   WBC CANCELED 12/25/2020   HGB 11.6 (L) 04/18/2019   HCT 34.5 (L) 04/18/2019   MCV 86.9 04/18/2019   PLT 381 04/18/2019     Attestation Statements:   Reviewed by clinician on day of visit: allergies, medications, problem list, medical history, surgical history, family history, social history, and previous encounter notes.  Coral Ceo, am acting as Location manager for Mina Marble, NP.  I have reviewed the above documentation for accuracy and completeness, and I agree with the above. -  Jalie Eiland d. Vietta Bonifield, NP-C

## 2021-01-29 ENCOUNTER — Other Ambulatory Visit (INDEPENDENT_AMBULATORY_CARE_PROVIDER_SITE_OTHER): Payer: Self-pay | Admitting: Family Medicine

## 2021-01-29 DIAGNOSIS — R7303 Prediabetes: Secondary | ICD-10-CM

## 2021-02-08 DIAGNOSIS — M25561 Pain in right knee: Secondary | ICD-10-CM | POA: Diagnosis not present

## 2021-02-08 DIAGNOSIS — M25562 Pain in left knee: Secondary | ICD-10-CM | POA: Diagnosis not present

## 2021-02-09 ENCOUNTER — Other Ambulatory Visit: Payer: Self-pay

## 2021-02-09 ENCOUNTER — Encounter (INDEPENDENT_AMBULATORY_CARE_PROVIDER_SITE_OTHER): Payer: Self-pay | Admitting: Adult Health

## 2021-02-09 ENCOUNTER — Ambulatory Visit (INDEPENDENT_AMBULATORY_CARE_PROVIDER_SITE_OTHER): Payer: 59 | Admitting: Adult Health

## 2021-02-09 VITALS — BP 122/78 | HR 75 | Temp 97.7°F | Ht 65.0 in | Wt 228.0 lb

## 2021-02-09 DIAGNOSIS — R7303 Prediabetes: Secondary | ICD-10-CM

## 2021-02-09 DIAGNOSIS — E559 Vitamin D deficiency, unspecified: Secondary | ICD-10-CM | POA: Diagnosis not present

## 2021-02-09 DIAGNOSIS — Z6838 Body mass index (BMI) 38.0-38.9, adult: Secondary | ICD-10-CM

## 2021-02-10 ENCOUNTER — Other Ambulatory Visit (HOSPITAL_COMMUNITY)
Admission: RE | Admit: 2021-02-10 | Discharge: 2021-02-10 | Disposition: A | Discharge status: Home / Self Care | Payer: 59 | Source: Ambulatory Visit | Attending: Otolaryngology | Admitting: Otolaryngology

## 2021-02-10 ENCOUNTER — Ambulatory Visit
Admission: RE | Admit: 2021-02-10 | Discharge: 2021-02-10 | Disposition: A | Discharge status: Home / Self Care | Payer: BC Managed Care – PPO | Source: Ambulatory Visit | Attending: Otolaryngology | Admitting: Otolaryngology

## 2021-02-10 DIAGNOSIS — E041 Nontoxic single thyroid nodule: Secondary | ICD-10-CM

## 2021-02-10 NOTE — Progress Notes (Signed)
Chief Complaint:   OBESITY Brandi Wise is here to discuss her progress with her obesity treatment plan along with follow-up of her obesity related diagnoses. Brandi Wise is on the Category 4 Plan and states she is following her eating plan approximately 85-90% of the time. [name] states she is doing crossfit 60 minutes 3-4 times per week.  Today's visit was #: 4 Starting weight: 223 lbs Starting date: 12/25/2020 Today's weight: 228 lbs Today's date: 02/09/2021 Total lbs lost to date: 0 Total lbs lost since last in-office visit: 1 lb  Interim History: Brandi Wise reports increased energy when she eats on the plan. She completed 2022 Cross[employer] Open- her gym team won! She has been packing snacks, meals, and gym clothes daily- great! Interval goal: take stairs at work; Crossfit class Monday  Subjective:   1. Pre-diabetes [name]'s 12/25/2020 A1c was 5.7 with elevated BG and insulin levels. She is on Metformin 500 mg with breakfast and tolerating it well.  Lab Results  Component Value Date   HGBA1C 5.7 (H) 12/25/2020   Lab Results  Component Value Date   INSULIN 34.2 (H) 12/25/2020    2. Vitamin D deficiency Brandi Wise's Vitamin D level was 17.9 on 12/25/2020. She is currently taking prescription vitamin D 50,000 IU each week. She denies nausea, vomiting or muscle weakness.  Assessment/Plan:   1. Pre-diabetes Brandi Wise will continue to work on weight loss, exercise, and decreasing simple carbohydrates to help decrease the risk of diabetes. Continue category 4 meal plan and regular exercise.  2. Vitamin D deficiency Low Vitamin D level contributes to fatigue and are associated with obesity, breast, and colon cancer. She agrees to continue to take prescription Vitamin D @50 ,000 IU every week and will follow-up for routine testing of Vitamin D, at least 2-3 times per year to avoid over-replacement.  3. Class 2 severe obesity with serious comorbidity and body mass index (BMI) of 38.0 to 38.9 in  adult, unspecified obesity type (HCC) [name] is currently in the action stage of change. As such, her goal is to continue with weight loss efforts. She has agreed to the Category 4 Plan.   Exercise goals: As is  Behavioral modification strategies: increasing lean protein intake, meal planning and cooking strategies and planning for success.  Brandi Wise has agreed to follow-up with our clinic in 2 weeks. She was informed of the importance of frequent follow-up visits to maximize her success with intensive lifestyle modifications for her multiple health conditions.   Objective:   Blood pressure 122/78, pulse 75, temperature 97.7 F (36.5 C), height 5\' 5"  (1.651 m), weight 228 lb (103.4 kg), SpO2 99 %, unknown if currently breastfeeding. Body mass index is 37.94 kg/m.  General: Cooperative, alert, well developed, in no acute distress. HEENT: Conjunctivae and lids unremarkable. Cardiovascular: Regular rhythm.  Lungs: Normal work of breathing. Neurologic: No focal deficits.   Lab Results  Component Value Date   CREATININE 0.74 12/25/2020   BUN 9 12/25/2020   NA 138 12/25/2020   K 4.3 12/25/2020   CL 105 12/25/2020   CO2 23 12/25/2020   Lab Results  Component Value Date   ALT 12 12/25/2020   AST 15 12/25/2020   ALKPHOS 84 12/25/2020   BILITOT 0.2 12/25/2020   Lab Results  Component Value Date   HGBA1C 5.7 (H) 12/25/2020   Lab Results  Component Value Date   INSULIN 34.2 (H) 12/25/2020   Lab Results  Component Value Date   TSH 0.567 12/25/2020   Lab Results  Component Value Date   CHOL 160 12/25/2020   HDL 54 12/25/2020   LDLCALC 97 12/25/2020   TRIG 39 12/25/2020   Lab Results  Component Value Date   WBC CANCELED 12/25/2020   HGB 11.6 (L) 04/18/2019   HCT 34.5 (L) 04/18/2019   MCV 86.9 04/18/2019   PLT 381 04/18/2019    Attestation Statements:   Reviewed by clinician on day of visit: allergies, medications, problem list, medical history, surgical history,  family history, social history, and previous encounter notes.  Time spent on visit including pre-visit chart review and post-visit care and charting was 31 minutes.   Coral Ceo, am acting as Location manager for Mina Marble, NP.  I have reviewed the above documentation for accuracy and completeness, and I agree with the above. -  Maleeah Crossman d. Emoree Sasaki, NP-C

## 2021-02-11 LAB — CYTOLOGY - NON PAP

## 2021-02-11 IMAGING — MR MR HEAD WO/W CM
12 series · 48 of 48 positions shown · IV contrast (20 ML MULTIHANCE)
Comparison: None available.

CLINICAL DATA: Initial evaluation for chronic headaches, increased
in frequency.

EXAM:
MRI HEAD WITHOUT AND WITH CONTRAST
TECHNIQUE: Multiplanar, multiecho pulse sequences of the brain and surrounding
structures were obtained without and with intravenous contrast.
CONTRAST:  20mL MULTIHANCE GADOBENATE DIMEGLUMINE 529 MG/ML IV SOLN

[Series 2: T1 · sagittal · 5.0mm · 0.45mm/px · 1 of 23 slices shown]
[im 1/23]
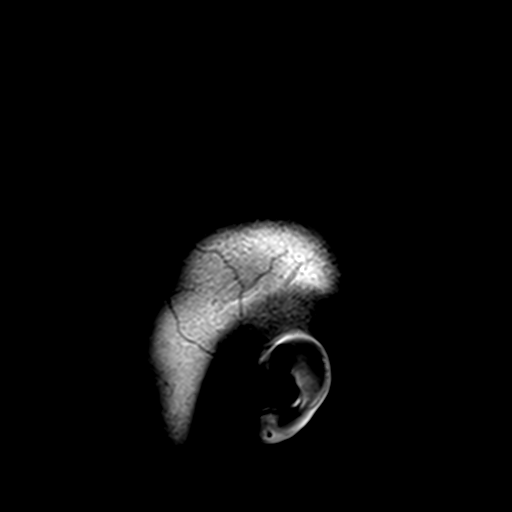

[Series 3: DWI · axial · 3.0mm · 1.80mm/px · z∈[-65,+82]mm · 7 of 100 slices shown (1 of 4)]
[im 1/100]
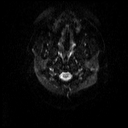
[im 17/100]
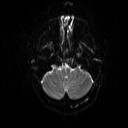
[im 34/100]
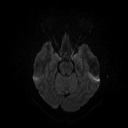
[im 50/100]
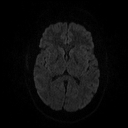
[im 67/100]
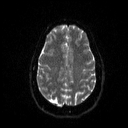
[im 83/100]
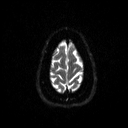
[im 100/100]
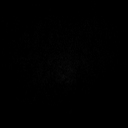

[Series 4: DWI · axial · 3.0mm · 1.80mm/px · z∈[-65,+82]mm · 3 of 48 slices shown (2 of 4)]
[im 1/48]
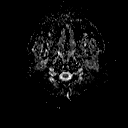
[im 24/48]
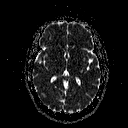
[im 48/48]
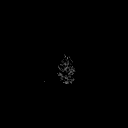

[Series 5: DWI · coronal · 5.0mm · 1.80mm/px · 5 of 72 slices shown (3 of 4)]
[im 1/72]
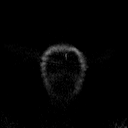
[im 18/72]
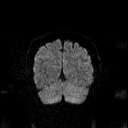
[im 36/72]
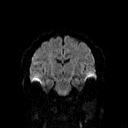
[im 54/72]
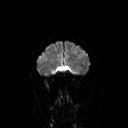
[im 72/72]
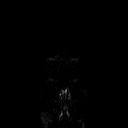

[Series 6: DWI · coronal · 5.0mm · 1.80mm/px · 3 of 36 slices shown (4 of 4)]
[im 1/36]
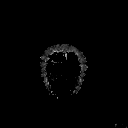
[im 18/36]
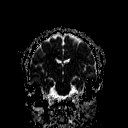
[im 36/36]
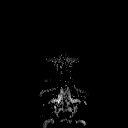

[Series 7: T2 · axial · 5.0mm · 0.60mm/px · z∈[-72,+69]mm · 2 of 22 slices shown (1 of 2)]
[im 1/22]
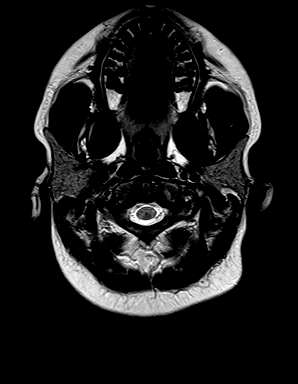
[im 22/22]
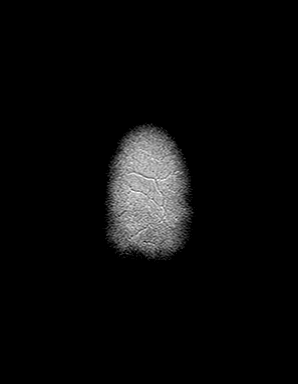

[Series 8: FLAIR · axial · 3.0mm · 0.45mm/px · z∈[-70,+65]mm · 2 of 30 slices shown]
[im 1/30]
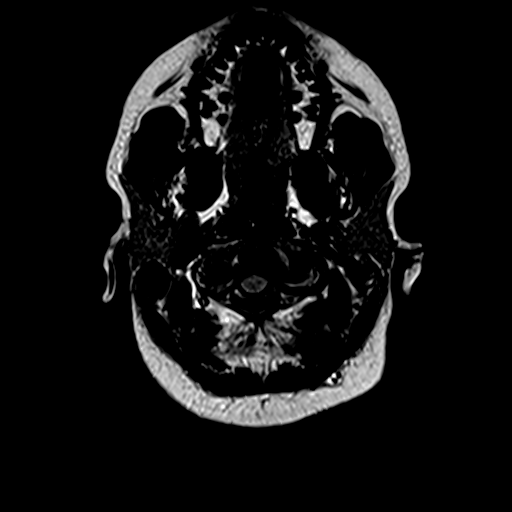
[im 30/30]
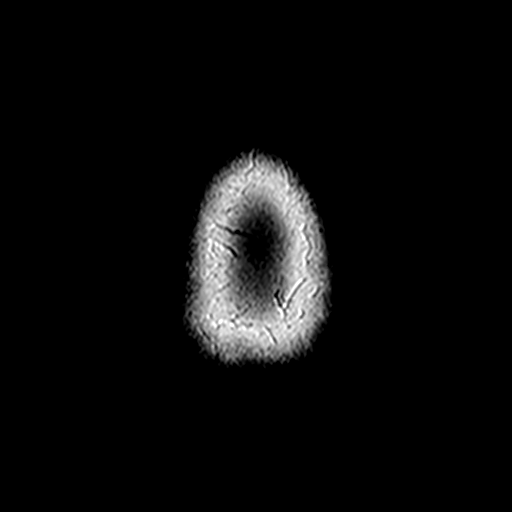

[Series 10: swi_images · axial · 4.0mm · 0.90mm/px · z∈[-72,+68]mm · 3 of 36 slices shown]
[im 1/36]
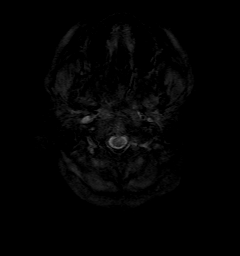
[im 18/36]
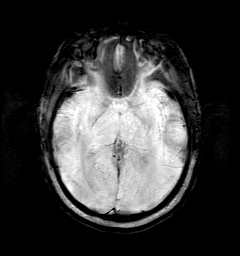
[im 36/36]
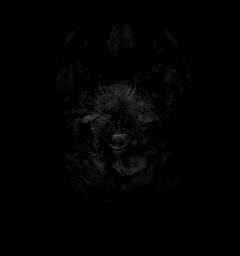

[Series 11: t1_mpr_tra · axial · 1.0mm · 0.75mm/px · z∈[-63,+64]mm · 9 of 128 slices shown (1 of 2)]
[im 1/128]
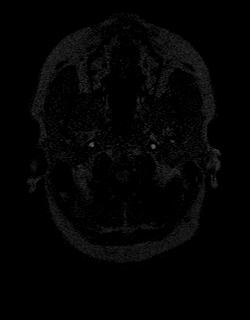
[im 16/128]
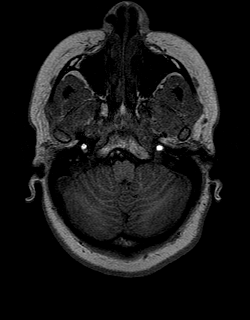
[im 32/128]
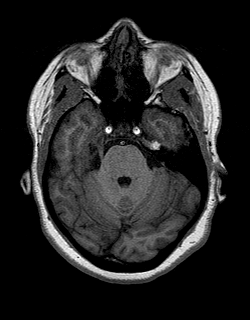
[im 48/128]
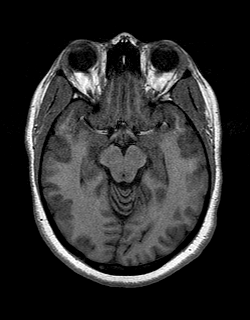
[im 64/128]
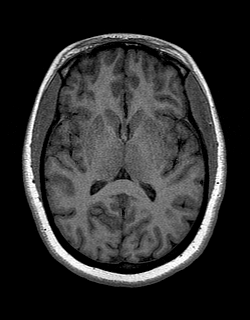
[im 80/128]
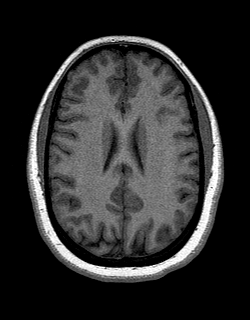
[im 96/128]
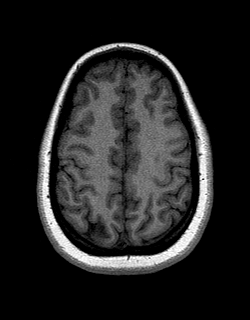
[im 112/128]
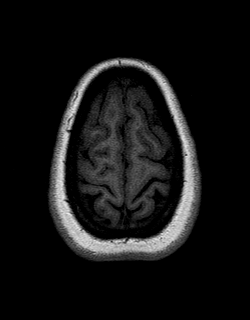
[im 128/128]
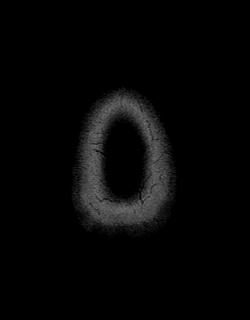

[Series 12: T2 · coronal · 5.0mm · 0.45mm/px · 2 of 28 slices shown (2 of 2)]
[im 1/28]
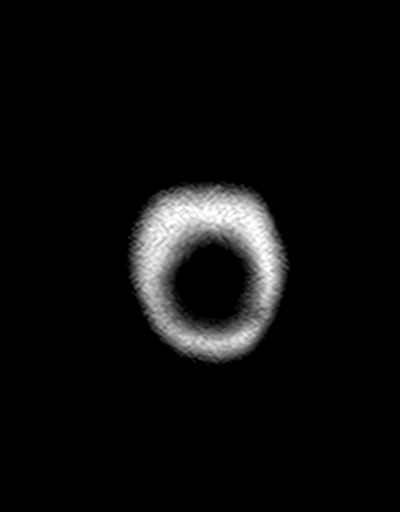
[im 28/28]
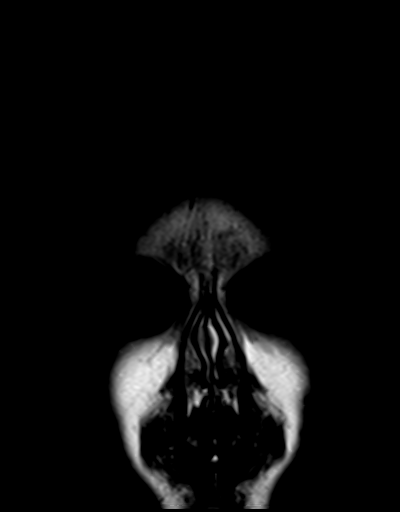

[Series 13: t1_mpr_tra · axial · 1.0mm · 0.75mm/px · z∈[-63,+64]mm · 9 of 128 slices shown (2 of 2)]
[im 1/128]
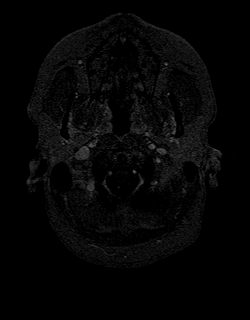
[im 16/128]
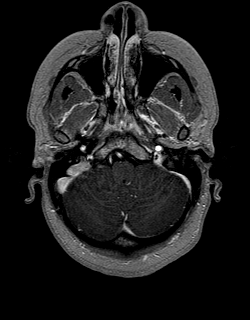
[im 32/128]
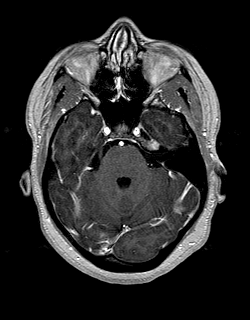
[im 48/128]
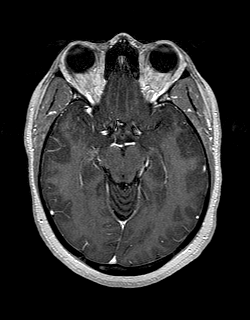
[im 64/128]
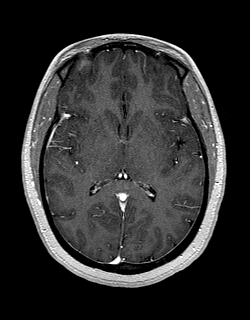
[im 80/128]
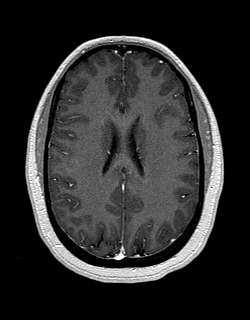
[im 96/128]
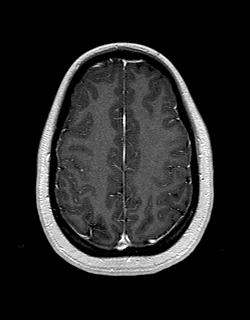
[im 112/128]
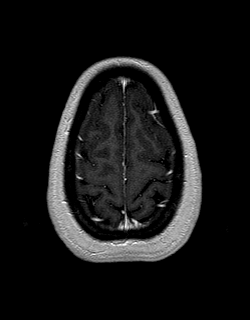
[im 128/128]
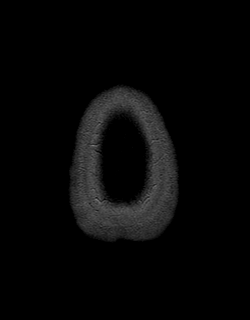

[Series 14: post cor · coronal · 5.0mm · 0.45mm/px · 2 of 28 slices shown]
[im 1/28]
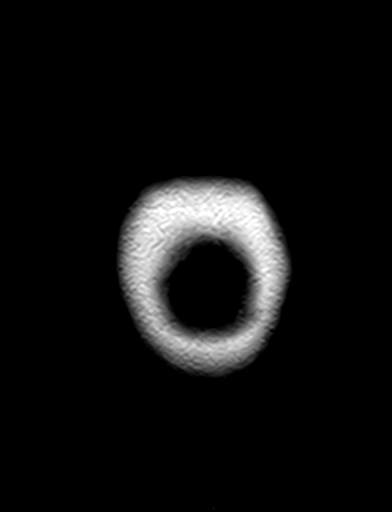
[im 28/28]
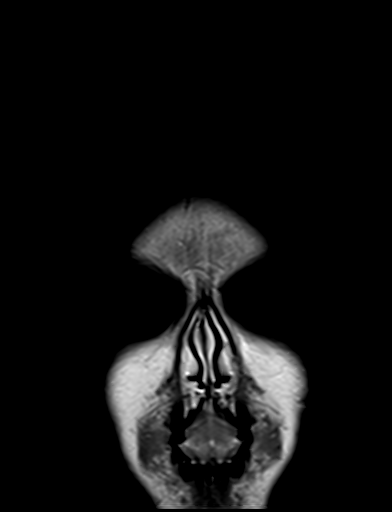

[48 of 48 positions shown; findings below may reference images not displayed]

FINDINGS: Brain: Cerebral volume within normal limits for patient age. No
focal parenchymal signal abnormality identified.

No abnormal foci of restricted diffusion to suggest acute or
subacute ischemia. Gray-white matter differentiation well
maintained. No encephalomalacia to suggest chronic infarction. No
foci of susceptibility artifact to suggest acute or chronic
intracranial hemorrhage.

No mass lesion, midline shift or mass effect. No hydrocephalus. No
extra-axial fluid collection. Major dural sinuses are grossly
patent.

Pituitary gland and suprasellar region are normal. Midline
structures intact and normal.

No abnormal enhancement.

Vascular: Major intracranial vascular flow voids well maintained and
normal in appearance.

Skull and upper cervical spine: Craniocervical junction normal.
Visualized upper cervical spine within normal limits. Bone marrow
signal intensity normal. No scalp soft tissue abnormality.

Sinuses/Orbits: Globes and orbital soft tissues within normal
limits.

Paranasal sinuses are clear. No mastoid effusion. Inner ear
structures are grossly normal.

Other: None.
IMPRESSION: Normal brain MRI. No acute intracranial abnormality or findings to
explain patient's symptoms identified.

## 2021-02-15 ENCOUNTER — Other Ambulatory Visit: Payer: Self-pay | Admitting: Otolaryngology

## 2021-02-15 DIAGNOSIS — M222X1 Patellofemoral disorders, right knee: Secondary | ICD-10-CM | POA: Diagnosis not present

## 2021-02-23 ENCOUNTER — Ambulatory Visit (INDEPENDENT_AMBULATORY_CARE_PROVIDER_SITE_OTHER): Payer: 59 | Admitting: Adult Health

## 2021-02-23 ENCOUNTER — Other Ambulatory Visit: Payer: Self-pay

## 2021-02-23 ENCOUNTER — Encounter (INDEPENDENT_AMBULATORY_CARE_PROVIDER_SITE_OTHER): Payer: Self-pay | Admitting: Adult Health

## 2021-02-23 VITALS — BP 105/71 | HR 74 | Temp 97.6°F | Ht 65.0 in | Wt 228.0 lb

## 2021-02-23 DIAGNOSIS — R7303 Prediabetes: Secondary | ICD-10-CM | POA: Diagnosis not present

## 2021-02-23 DIAGNOSIS — Z6838 Body mass index (BMI) 38.0-38.9, adult: Secondary | ICD-10-CM | POA: Diagnosis not present

## 2021-02-23 DIAGNOSIS — E559 Vitamin D deficiency, unspecified: Secondary | ICD-10-CM | POA: Diagnosis not present

## 2021-02-24 ENCOUNTER — Other Ambulatory Visit (INDEPENDENT_AMBULATORY_CARE_PROVIDER_SITE_OTHER): Payer: Self-pay | Admitting: Adult Health

## 2021-02-24 DIAGNOSIS — R7303 Prediabetes: Secondary | ICD-10-CM

## 2021-02-24 NOTE — Progress Notes (Signed)
Chief Complaint:   OBESITY Brandi Wise is here to discuss her progress with her obesity treatment plan along with follow-up of her obesity related diagnoses. Brandi Wise is on the Category 4 Plan and states she is following her eating plan approximately 90% of the time. Brandi Wise states she is corssfit 60 minutes 4 times per week.  Today's visit was #: 5 Starting weight: 223 lbs Starting date: 12/25/2020 Today's weight: 228 lbs Today's date: 02/23/2021 Total lbs lost to date: 0 Total lbs lost since last in-office visit: 0  Interim History: Brandi Wise is scheduled for L thyroid Lobectomy on 03/24/2021. She reports intermittent dysphagia. She has close friends that can assist with her recovery. She denies family hx of Thyroid Ca.  Subjective:   1. Pre-diabetes Brandi Wise is on Metformin and denies GI upset.   Lab Results  Component Value Date   HGBA1C 5.7 (H) 12/25/2020   Lab Results  Component Value Date   INSULIN 34.2 (H) 12/25/2020    2. Vitamin D deficiency Brandi Wise's Vitamin D level was 17.9 on 12/25/2020. She is currently taking prescription vitamin D 50,000 IU each week. She denies nausea, vomiting or muscle weakness.   Ref. Range 12/25/2020 08:42  Vitamin D, 25-Hydroxy Latest Ref Range: 30.0 - 100.0 ng/mL 17.9 (L)   Assessment/Plan:   1. Pre-diabetes Brandi Wise will continue to work on weight loss, exercise, and decreasing simple carbohydrates to help decrease the risk of diabetes. Continue Metformin.  2. Vitamin D deficiency Low Vitamin D level contributes to fatigue and are associated with obesity, breast, and colon cancer. She agrees to continue to take prescription Vitamin D @50 ,000 IU every week and will follow-up for routine testing of Vitamin D, at least 2-3 times per year to avoid over-replacement.  3. Class 2 severe obesity with serious comorbidity and body mass index (BMI) of 38.0 to 38.9 in adult, unspecified obesity type (HCC) Brandi Wise is currently in the action stage of  change. As such, her goal is to continue with weight loss efforts. She has agreed to the Category 4 Plan.   For gym days- add 150 snack calories (for a total of 450/day)  Exercise goals: As is  Behavioral modification strategies: increasing lean protein intake, meal planning and cooking strategies and planning for success.  Duane has agreed to follow-up with our clinic in 2 weeks. She was informed of the importance of frequent follow-up visits to maximize her success with intensive lifestyle modifications for her multiple health conditions.   Objective:   Blood pressure 105/71, pulse 74, temperature 97.6 F (36.4 C), height 5\' 5"  (1.651 m), weight 228 lb (103.4 kg), SpO2 98 %, unknown if currently breastfeeding. Body mass index is 37.94 kg/m.  General: Cooperative, alert, well developed, in no acute distress. HEENT: Conjunctivae and lids unremarkable. Cardiovascular: Regular rhythm.  Lungs: Normal work of breathing. Neurologic: No focal deficits.   Lab Results  Component Value Date   CREATININE 0.74 12/25/2020   BUN 9 12/25/2020   NA 138 12/25/2020   K 4.3 12/25/2020   CL 105 12/25/2020   CO2 23 12/25/2020   Lab Results  Component Value Date   ALT 12 12/25/2020   AST 15 12/25/2020   ALKPHOS 84 12/25/2020   BILITOT 0.2 12/25/2020   Lab Results  Component Value Date   HGBA1C 5.7 (H) 12/25/2020   Lab Results  Component Value Date   INSULIN 34.2 (H) 12/25/2020   Lab Results  Component Value Date   TSH 0.567 12/25/2020  Lab Results  Component Value Date   CHOL 160 12/25/2020   HDL 54 12/25/2020   LDLCALC 97 12/25/2020   TRIG 39 12/25/2020   Lab Results  Component Value Date   WBC CANCELED 12/25/2020   HGB 11.6 (L) 04/18/2019   HCT 34.5 (L) 04/18/2019   MCV 86.9 04/18/2019   PLT 381 04/18/2019    Attestation Statements:   Reviewed by clinician on day of visit: allergies, medications, problem list, medical history, surgical history, family history,  social history, and previous encounter notes.  Time spent on visit including pre-visit chart review and post-visit care and charting was 25 minutes.   Coral Ceo, am acting as Location manager for Mina Marble, NP.  I have reviewed the above documentation for accuracy and completeness, and I agree with the above. -  Avy Barlett d. Alwilda Gilland, NP-C

## 2021-03-11 ENCOUNTER — Encounter (INDEPENDENT_AMBULATORY_CARE_PROVIDER_SITE_OTHER): Payer: Self-pay | Admitting: Adult Health

## 2021-03-11 ENCOUNTER — Other Ambulatory Visit: Payer: Self-pay

## 2021-03-11 ENCOUNTER — Ambulatory Visit (INDEPENDENT_AMBULATORY_CARE_PROVIDER_SITE_OTHER): Payer: 59 | Admitting: Adult Health

## 2021-03-11 VITALS — BP 109/73 | HR 77 | Temp 98.0°F | Ht 65.0 in | Wt 222.0 lb

## 2021-03-11 DIAGNOSIS — Z6837 Body mass index (BMI) 37.0-37.9, adult: Secondary | ICD-10-CM | POA: Diagnosis not present

## 2021-03-11 DIAGNOSIS — E559 Vitamin D deficiency, unspecified: Secondary | ICD-10-CM

## 2021-03-11 DIAGNOSIS — Z9189 Other specified personal risk factors, not elsewhere classified: Secondary | ICD-10-CM | POA: Diagnosis not present

## 2021-03-11 DIAGNOSIS — R7303 Prediabetes: Secondary | ICD-10-CM | POA: Diagnosis not present

## 2021-03-11 MED ORDER — VITAMIN D (ERGOCALCIFEROL) 1.25 MG (50000 UNIT) PO CAPS: 50000.0000 [IU] | ORAL_CAPSULE | ORAL | 0 refills | Status: DC

## 2021-03-15 NOTE — Progress Notes (Signed)
Chief Complaint:   OBESITY Brandi Wise is here to discuss her progress with her obesity treatment plan along with follow-up of her obesity related diagnoses. Brandi Wise is on the Category 4 Plan and states she is following her eating plan approximately 90% of the time. [name] states she is crossfit 60 minutes 4 times per week.  Today's visit was #: 6 Starting weight: 223 lbs Starting date: 12/25/2020 Today's weight: 222 lbs Today's date: 03/11/2021 Total lbs lost to date: 1 Total lbs lost since last in-office visit: 6  Interim History: Brandi Wise will be traveling to [place] next week for 5 days with 2 of two best friends. Discussed travel eating strategies. She is scheduled for thyroidectomy 03/24/2021.   Subjective:   1. Vitamin D deficiency Adrean's Vitamin D level was 17.9 on 12/25/2020, which is well below goal of 50.Marland Kitchen She is currently taking prescription vitamin D 50,000 IU each week. She denies nausea, vomiting or muscle weakness.  2. Pre-diabetes Brandi Wise is on Metformin 500 mg QD at breakfast and tolerating it well. 12/24/2020 A1c 5.7 with elevated BG 105 and insulin level 34.2.  Lab Results  Component Value Date   HGBA1C 5.7 (H) 12/25/2020   Lab Results  Component Value Date   INSULIN 34.2 (H) 12/25/2020    3. At risk for osteoporosis Brandi Wise is at higher risk of osteopenia and osteoporosis due to Vitamin D deficiency.   Assessment/Plan:   1. Vitamin D deficiency Low Vitamin D level contributes to fatigue and are associated with obesity, breast, and colon cancer. She agrees to continue to take prescription Vitamin D @50 ,000 IU every week and will follow-up for routine testing of Vitamin D, at least 2-3 times per year to avoid over-replacement.  - Vitamin D, Ergocalciferol, (DRISDOL) 1.25 MG (50000 UNIT) CAPS capsule; Take 1 capsule (50,000 Units total) by mouth every 7 (seven) days.  Dispense: 4 capsule; Refill: 0  2. Pre-diabetes Brandi Wise will continue to work on  weight loss, exercise, and decreasing simple carbohydrates to help decrease the risk of diabetes. Continue Metformin. No refill needed today.  3. At risk for osteoporosis Brandi Wise was given approximately 15 minutes of osteoporosis prevention counseling today. Brandi Wise is at risk for osteopenia and osteoporosis due to her Vitamin D deficiency. She was encouraged to take her Vitamin D and follow her higher calcium diet and increase strengthening exercise to help strengthen her bones and decrease her risk of osteopenia and osteoporosis.  Repetitive spaced learning was employed today to elicit superior memory formation and behavioral change.  4. Class 2 severe obesity with serious comorbidity and body mass index (BMI) of 37.0 to 37.9 in adult, unspecified obesity type (HCC) Brandi Wise is currently in the action stage of change. As such, her goal is to continue with weight loss efforts.  After thyroidectomy - follow any post-op dietary guidelines per surgeon. She has agreed to follow Category 4 Plan in the interim- increase protein intake prior to procedure.  Exercise goals: As is  Behavioral modification strategies: increasing lean protein intake, meal planning and cooking strategies, travel eating strategies and planning for success.  Brandi Wise has agreed to follow-up with our clinic in 4 weeks. She was informed of the importance of frequent follow-up visits to maximize her success with intensive lifestyle modifications for her multiple health conditions.   Objective:   Blood pressure 109/73, pulse 77, temperature 98 F (36.7 C), height 5\' 5"  (1.651 m), weight 222 lb (100.7 kg), SpO2 98 %, unknown if currently breastfeeding. Body mass  index is 36.94 kg/m.  General: Cooperative, alert, well developed, in no acute distress. HEENT: Conjunctivae and lids unremarkable. Cardiovascular: Regular rhythm.  Lungs: Normal work of breathing. Neurologic: No focal deficits.   Lab Results  Component Value Date    CREATININE 0.74 12/25/2020   BUN 9 12/25/2020   NA 138 12/25/2020   K 4.3 12/25/2020   CL 105 12/25/2020   CO2 23 12/25/2020   Lab Results  Component Value Date   ALT 12 12/25/2020   AST 15 12/25/2020   ALKPHOS 84 12/25/2020   BILITOT 0.2 12/25/2020   Lab Results  Component Value Date   HGBA1C 5.7 (H) 12/25/2020   Lab Results  Component Value Date   INSULIN 34.2 (H) 12/25/2020   Lab Results  Component Value Date   TSH 0.567 12/25/2020   Lab Results  Component Value Date   CHOL 160 12/25/2020   HDL 54 12/25/2020   LDLCALC 97 12/25/2020   TRIG 39 12/25/2020   Lab Results  Component Value Date   WBC CANCELED 12/25/2020   HGB 11.6 (L) 04/18/2019   HCT 34.5 (L) 04/18/2019   MCV 86.9 04/18/2019   PLT 381 04/18/2019    Attestation Statements:   Reviewed by clinician on day of visit: allergies, medications, problem list, medical history, surgical history, family history, social history, and previous encounter notes.  Coral Ceo, am acting as Location manager for Mina Marble, NP.  I have reviewed the above documentation for accuracy and completeness, and I agree with the above. -  Judia Arnott d. Tyrus Wilms, NP-C

## 2021-03-22 ENCOUNTER — Other Ambulatory Visit (HOSPITAL_COMMUNITY)
Admission: RE | Admit: 2021-03-22 | Discharge: 2021-03-22 | Disposition: A | Discharge status: Home / Self Care | Payer: 59 | Source: Ambulatory Visit | Attending: Otolaryngology | Admitting: Otolaryngology

## 2021-03-22 DIAGNOSIS — Z01812 Encounter for preprocedural laboratory examination: Secondary | ICD-10-CM | POA: Diagnosis not present

## 2021-03-22 DIAGNOSIS — U071 COVID-19: Secondary | ICD-10-CM | POA: Diagnosis not present

## 2021-03-23 ENCOUNTER — Encounter (INDEPENDENT_AMBULATORY_CARE_PROVIDER_SITE_OTHER): Payer: Self-pay

## 2021-03-23 ENCOUNTER — Other Ambulatory Visit: Payer: Self-pay | Admitting: Otolaryngology

## 2021-03-23 ENCOUNTER — Other Ambulatory Visit (INDEPENDENT_AMBULATORY_CARE_PROVIDER_SITE_OTHER): Payer: Self-pay | Admitting: Adult Health

## 2021-03-23 DIAGNOSIS — R7303 Prediabetes: Secondary | ICD-10-CM

## 2021-03-23 LAB — SARS CORONAVIRUS 2 (TAT 6-24 HRS): SARS Coronavirus 2: POSITIVE — AB

## 2021-03-23 NOTE — Progress Notes (Signed)
Left message for Brandi Wise at Dr. Redmond Baseman office about covid test results

## 2021-03-23 NOTE — Telephone Encounter (Signed)
Message sent to pt.

## 2021-03-24 ENCOUNTER — Telehealth (HOSPITAL_COMMUNITY): Payer: Self-pay

## 2021-03-24 ENCOUNTER — Ambulatory Visit (HOSPITAL_COMMUNITY): Admission: RE | Admit: 2021-03-24 | Payer: 59 | Source: Home / Self Care | Admitting: Otolaryngology

## 2021-03-24 NOTE — Telephone Encounter (Signed)
Called to discuss with patient about COVID-19 symptoms and the use of one of the available treatments for those with mild to moderate Covid symptoms and at a high risk of hospitalization.  Pt appears to qualify for outpatient treatment due to co-morbid conditions and/or a member of an at-risk group in accordance with the FDA Emergency Use Authorization.    Symptom onset: Wednesday 03/17/21 Vaccinated: Yes  Pt does not qualify for treatment since symptom onset >7 days.  Symptoms tier reviewed as well as criteria for ending isolation. Preventative practices reviewed. Patient verbalized understanding.  Patient advised to go to Urgent care or ED with severe symptoms.    Janine Ores, RN

## 2021-03-29 DIAGNOSIS — R300 Dysuria: Secondary | ICD-10-CM | POA: Diagnosis not present

## 2021-04-08 ENCOUNTER — Other Ambulatory Visit: Payer: Self-pay

## 2021-04-08 ENCOUNTER — Ambulatory Visit (INDEPENDENT_AMBULATORY_CARE_PROVIDER_SITE_OTHER): Payer: 59 | Admitting: Adult Health

## 2021-04-08 ENCOUNTER — Encounter (INDEPENDENT_AMBULATORY_CARE_PROVIDER_SITE_OTHER): Payer: Self-pay | Admitting: Adult Health

## 2021-04-08 VITALS — BP 113/75 | HR 75 | Temp 98.1°F | Ht 65.0 in | Wt 221.0 lb

## 2021-04-08 DIAGNOSIS — E559 Vitamin D deficiency, unspecified: Secondary | ICD-10-CM

## 2021-04-08 DIAGNOSIS — R7303 Prediabetes: Secondary | ICD-10-CM

## 2021-04-08 DIAGNOSIS — Z6838 Body mass index (BMI) 38.0-38.9, adult: Secondary | ICD-10-CM | POA: Diagnosis not present

## 2021-04-12 NOTE — Progress Notes (Signed)
Chief Complaint:   OBESITY Brandi Wise is here to discuss her progress with her obesity treatment plan along with follow-up of her obesity related diagnoses. Brandi Wise is on the Category 4 Plan and states she is following her eating plan approximately 0% of the time. Brandi Wise states she is not currently exercising.  Today's visit was #: 7 Starting weight: 223 lbs Starting date: 12/25/2020 Today's weight: 221 lbs Today's date: 04/08/2021 Total lbs lost to date: 2 Total lbs lost since last in-office visit: 1  Interim History: 23 Apr 22- positive SARS-COV-2 PCR test. Brandi Wise has no current symptoms and has returned to work.  Her first Crossfit exercise class was 3 days ago. She was able to keep up in class, however, very fatigued afterwards. Thyroidectomy rescheduled for May 19, 2021, due to recent COVID-19 infection.  Subjective:   1. Pre-diabetes Brandi Wise was off Metformin 500 mg QD while acutely ill with COVID-19 symptoms. She denies acute symptoms at present. She is ready to restart Biguanide.  2. Vitamin D deficiency Brandi Wise Vitamin D level was 17.9 on 12/25/2020, which is well below goal of 50. She is currently taking prescription vitamin D 50,000 IU each week. She denies nausea, vomiting or muscle weakness.  Assessment/Plan:   1. Pre-diabetes Brandi Wise will continue to work on weight loss, exercise, and decreasing simple carbohydrates to help decrease the risk of diabetes.  -Resume Metformin and slowly advance activity as tolerated.  2. Vitamin D deficiency Low Vitamin D level contributes to fatigue and are associated with obesity, breast, and colon cancer. She agrees to continue to take prescription Vitamin D @50 ,000 IU every week and will follow-up for routine testing of Vitamin D, at least 2-3 times per year to avoid over-replacement. Pt denies need for refill today.  3. Obesity with current BMI 36.8  Brandi Wise is currently in the action stage of change. As such, her goal is to  continue with weight loss efforts. She has agreed to the Category 4 Plan.   Exercise goals: Advance activity as tolerated.  Behavioral modification strategies: increasing lean protein intake, meal planning and cooking strategies and planning for success.  Brandi Wise has agreed to follow-up with our clinic in 4 weeks. She was informed of the importance of frequent follow-up visits to maximize her success with intensive lifestyle modifications for her multiple health conditions.   Objective:   Blood pressure 113/75, pulse 75, temperature 98.1 F (36.7 C), height 5\' 5"  (1.651 m), weight 221 lb (100.2 kg), SpO2 97 %, unknown if currently breastfeeding. Body mass index is 36.78 kg/m.  General: Cooperative, alert, well developed, in no acute distress. HEENT: Conjunctivae and lids unremarkable. Cardiovascular: Regular rhythm.  Lungs: Normal work of breathing. Neurologic: No focal deficits.   Lab Results  Component Value Date   CREATININE 0.74 12/25/2020   BUN 9 12/25/2020   NA 138 12/25/2020   K 4.3 12/25/2020   CL 105 12/25/2020   CO2 23 12/25/2020   Lab Results  Component Value Date   ALT 12 12/25/2020   AST 15 12/25/2020   ALKPHOS 84 12/25/2020   BILITOT 0.2 12/25/2020   Lab Results  Component Value Date   HGBA1C 5.7 (H) 12/25/2020   Lab Results  Component Value Date   INSULIN 34.2 (H) 12/25/2020   Lab Results  Component Value Date   TSH 0.567 12/25/2020   Lab Results  Component Value Date   CHOL 160 12/25/2020   HDL 54 12/25/2020   LDLCALC 97 12/25/2020   TRIG  39 12/25/2020   Lab Results  Component Value Date   WBC CANCELED 12/25/2020   HGB 11.6 (L) 04/18/2019   HCT 34.5 (L) 04/18/2019   MCV 86.9 04/18/2019   PLT 381 04/18/2019   No results found for: IRON, TIBC, FERRITIN   Attestation Statements:   Reviewed by clinician on day of visit: allergies, medications, problem list, medical history, surgical history, family history, social history, and previous  encounter notes.  Time spent on visit including pre-visit chart review and post-visit care and charting was 32 minutes.   Coral Ceo, CMA, am acting as transcriptionist for Mina Marble, NP.  I have reviewed the above documentation for accuracy and completeness, and I agree with the above. -  Kaetlyn Noa d. Lilac Hoff, NP-C

## 2021-04-27 DIAGNOSIS — M9903 Segmental and somatic dysfunction of lumbar region: Secondary | ICD-10-CM | POA: Diagnosis not present

## 2021-04-27 DIAGNOSIS — M9901 Segmental and somatic dysfunction of cervical region: Secondary | ICD-10-CM | POA: Diagnosis not present

## 2021-04-27 DIAGNOSIS — M9904 Segmental and somatic dysfunction of sacral region: Secondary | ICD-10-CM | POA: Diagnosis not present

## 2021-04-27 DIAGNOSIS — M9905 Segmental and somatic dysfunction of pelvic region: Secondary | ICD-10-CM | POA: Diagnosis not present

## 2021-04-28 DIAGNOSIS — M9905 Segmental and somatic dysfunction of pelvic region: Secondary | ICD-10-CM | POA: Diagnosis not present

## 2021-04-28 DIAGNOSIS — M9903 Segmental and somatic dysfunction of lumbar region: Secondary | ICD-10-CM | POA: Diagnosis not present

## 2021-04-28 DIAGNOSIS — M9904 Segmental and somatic dysfunction of sacral region: Secondary | ICD-10-CM | POA: Diagnosis not present

## 2021-04-28 DIAGNOSIS — M9901 Segmental and somatic dysfunction of cervical region: Secondary | ICD-10-CM | POA: Diagnosis not present

## 2021-05-03 DIAGNOSIS — M9905 Segmental and somatic dysfunction of pelvic region: Secondary | ICD-10-CM | POA: Diagnosis not present

## 2021-05-03 DIAGNOSIS — M9903 Segmental and somatic dysfunction of lumbar region: Secondary | ICD-10-CM | POA: Diagnosis not present

## 2021-05-03 DIAGNOSIS — M9901 Segmental and somatic dysfunction of cervical region: Secondary | ICD-10-CM | POA: Diagnosis not present

## 2021-05-03 DIAGNOSIS — M9904 Segmental and somatic dysfunction of sacral region: Secondary | ICD-10-CM | POA: Diagnosis not present

## 2021-05-04 DIAGNOSIS — M9904 Segmental and somatic dysfunction of sacral region: Secondary | ICD-10-CM | POA: Diagnosis not present

## 2021-05-04 DIAGNOSIS — M9905 Segmental and somatic dysfunction of pelvic region: Secondary | ICD-10-CM | POA: Diagnosis not present

## 2021-05-04 DIAGNOSIS — M9901 Segmental and somatic dysfunction of cervical region: Secondary | ICD-10-CM | POA: Diagnosis not present

## 2021-05-04 DIAGNOSIS — M9903 Segmental and somatic dysfunction of lumbar region: Secondary | ICD-10-CM | POA: Diagnosis not present

## 2021-05-06 ENCOUNTER — Ambulatory Visit (INDEPENDENT_AMBULATORY_CARE_PROVIDER_SITE_OTHER): Payer: 59 | Admitting: Adult Health

## 2021-05-11 NOTE — Pre-Procedure Instructions (Signed)
Surgical Instructions    Your procedure is scheduled on Wednesday June 22nd.  Report to [facility] Main Entrance "A" at 07:30 A.M., then check in with the Admitting office.  Call this number if you have problems the morning of surgery:  [phone]   If you have any questions prior to your surgery date call [phone]: Open Monday-Friday 8am-4pm    Remember:  Do not eat or drink after midnight the night before your surgery     Take these medicines the morning of surgery with A SIP OF WATER  None  Do not take metFORMIN (GLUCOPHAGE) the morning of surgery.     As of today, STOP taking any Aspirin (unless otherwise instructed by your surgeon) Aleve, Naproxen, Ibuprofen, Motrin, Advil, Goody's, BC's, all herbal medications, fish oil, and all vitamins.          Do not wear jewelry or makeup Do not wear lotions, powders, perfumes/colognes, or deodorant. Do not shave 48 hours prior to surgery.  Men may shave face and neck. Do not bring valuables to the hospital. DO Not wear nail polish, gel polish, artificial nails, or any other type of covering on natural nails including finger and toenails. If patients have artificial nails, gel coating, etc. that need to be removed by a nail salon please have this removed prior to surgery or surgery may need to be canceled/delayed if the surgeon/ anesthesia feels like the patient is unable to be adequately monitored.             [facility] is not responsible for any belongings or valuables.  Do NOT Smoke (Tobacco/Vaping) or drink Alcohol 24 hours prior to your procedure If you use a CPAP at night, you may bring all equipment for your overnight stay.   Contacts, glasses, dentures or bridgework may not be worn into surgery, please bring cases for these belongings   For patients admitted to the hospital, discharge time will be determined by your treatment team.   Patients discharged the day of surgery will not be allowed to drive home, and  someone needs to stay with them for 24 hours.  ONLY 1 SUPPORT PERSON MAY BE PRESENT WHILE YOU ARE IN SURGERY. IF YOU ARE TO BE ADMITTED ONCE YOU ARE IN YOUR ROOM YOU WILL BE ALLOWED TWO (2) VISITORS.  Minor children may have two parents present. Special consideration for safety and communication needs will be reviewed on a case by case basis.  Special instructions:    Oral Hygiene is also important to reduce your risk of infection.  Remember - BRUSH YOUR TEETH THE MORNING OF SURGERY WITH YOUR REGULAR TOOTHPASTE   [place]- Preparing For Surgery  Before surgery, you can play an important role. Because skin is not sterile, your skin needs to be as free of germs as possible. You can reduce the number of germs on your skin by washing with CHG (chlorahexidine gluconate) Soap before surgery.  CHG is an antiseptic cleaner which kills germs and bonds with the skin to continue killing germs even after washing.     Please do not use if you have an allergy to CHG or antibacterial soaps. If your skin becomes reddened/irritated stop using the CHG.  Do not shave (including legs and underarms) for at least 48 hours prior to first CHG shower. It is OK to shave your face.  Please follow these instructions carefully.     Shower the NIGHT BEFORE SURGERY and the MORNING OF SURGERY with CHG Soap.  If you chose to wash your hair, wash your hair first as usual with your normal shampoo. After you shampoo, rinse your hair and body thoroughly to remove the shampoo.  Then [employer] and genitals (private parts) with your normal soap and rinse thoroughly to remove soap.  After that Use CHG Soap as you would any other liquid soap. You can apply CHG directly to the skin and wash gently with a scrungie or a clean washcloth.   Apply the CHG Soap to your body ONLY FROM THE NECK DOWN.  Do not use on open wounds or open sores. Avoid contact with your eyes, ears, mouth and genitals (private parts). Wash Face and genitals  (private parts)  with your normal soap.   Wash thoroughly, paying special attention to the area where your surgery will be performed.  Thoroughly rinse your body with warm water from the neck down.  DO NOT shower/wash with your normal soap after using and rinsing off the CHG Soap.  Pat yourself dry with a CLEAN TOWEL.  Wear CLEAN PAJAMAS to bed the night before surgery  Place CLEAN SHEETS on your bed the night before your surgery  DO NOT SLEEP WITH PETS.   Day of Surgery:  Take a shower with CHG soap. Wear Clean/Comfortable clothing the morning of surgery Do not apply any deodorants/lotions.   Remember to brush your teeth WITH YOUR REGULAR TOOTHPASTE.   Please read over the following fact sheets that you were given.

## 2021-05-12 ENCOUNTER — Other Ambulatory Visit: Payer: Self-pay

## 2021-05-12 ENCOUNTER — Encounter (HOSPITAL_COMMUNITY)
Admission: RE | Admit: 2021-05-12 | Discharge: 2021-05-12 | Disposition: A | Discharge status: Home / Self Care | Payer: 59 | Source: Ambulatory Visit | Attending: Otolaryngology | Admitting: Otolaryngology

## 2021-05-12 ENCOUNTER — Encounter (HOSPITAL_COMMUNITY): Payer: Self-pay

## 2021-05-12 DIAGNOSIS — Z01812 Encounter for preprocedural laboratory examination: Secondary | ICD-10-CM | POA: Diagnosis not present

## 2021-05-12 DIAGNOSIS — M9905 Segmental and somatic dysfunction of pelvic region: Secondary | ICD-10-CM | POA: Diagnosis not present

## 2021-05-12 DIAGNOSIS — M9904 Segmental and somatic dysfunction of sacral region: Secondary | ICD-10-CM | POA: Diagnosis not present

## 2021-05-12 DIAGNOSIS — M9903 Segmental and somatic dysfunction of lumbar region: Secondary | ICD-10-CM | POA: Diagnosis not present

## 2021-05-12 DIAGNOSIS — M9901 Segmental and somatic dysfunction of cervical region: Secondary | ICD-10-CM | POA: Diagnosis not present

## 2021-05-12 HISTORY — DX: Prediabetes: R73.03

## 2021-05-12 LAB — BASIC METABOLIC PANEL
Anion gap: 8 (ref 5–15)
BUN: 16 mg/dL (ref 6–20)
CO2: 23 mmol/L (ref 22–32)
Calcium: 8.5 mg/dL — ABNORMAL LOW (ref 8.9–10.3)
Chloride: 105 mmol/L (ref 98–111)
Creatinine, Ser: 0.84 mg/dL (ref 0.44–1.00)
GFR, Estimated: >60 mL/min (ref >60)
Glucose, Bld: 73 mg/dL (ref 70–99)
Potassium: 4.1 mmol/L (ref 3.5–5.1)
Sodium: 136 mmol/L (ref 135–145)

## 2021-05-12 LAB — CBC
HCT: 38.5 % (ref 36.0–46.0)
Hemoglobin: 12.5 g/dL (ref 12.0–15.0)
MCH: 29.3 pg (ref 26.0–34.0)
MCHC: 32.5 g/dL (ref 30.0–36.0)
MCV: 90.4 fL (ref 80.0–100.0)
Platelets: 415 10*3/uL — ABNORMAL HIGH (ref 150–400)
RBC: 4.26 MIL/uL (ref 3.87–5.11)
RDW: 13.4 % (ref 11.5–15.5)
WBC: 9.6 10*3/uL (ref 4.0–10.5)
nRBC: 0 % (ref 0.0–0.2)

## 2021-05-12 NOTE — Progress Notes (Signed)
PCP: Aura Dials, MD Cardiologist: Denies  EKG: 12/25/20 CXR: na ECHO: denies Stress Test: denies Cardiac Cath: denies  Fasting Blood Sugar- pre-diabetic.  Does not check sugars.  Does not take Metformin often.  Checks Blood Sugar__0_ times a day  OSA/CPAP: No  ASA/Blood Thinners: No  Covid positive 03/22/21.  No need to repeat Covid test.   Anesthesia Review: No  Patient denies shortness of breath, fever, cough, and chest pain at PAT appointment.  Patient verbalized understanding of instructions provided today at the PAT appointment.  Patient asked to review instructions at home and day of surgery.

## 2021-05-18 NOTE — Anesthesia Preprocedure Evaluation (Addendum)
Anesthesia Evaluation  Patient identified by MRN, date of birth, ID band Patient awake    Reviewed: Allergy & Precautions, NPO status , Patient's Chart, lab work & pertinent test results  Airway Mallampati: III  TM Distance: >3 FB Neck ROM: Full    Dental no notable dental hx. (+) Teeth Intact, Dental Advisory Given   Pulmonary neg pulmonary ROS,    Pulmonary exam normal breath sounds clear to auscultation       Cardiovascular negative cardio ROS Normal cardiovascular exam Rhythm:Regular Rate:Normal     Neuro/Psych negative neurological ROS  negative psych ROS   GI/Hepatic negative GI ROS, Neg liver ROS,   Endo/Other  diabetes, Well Controlled, Type 2, Oral Hypoglycemic AgentsObesity BMI 38 Thyroid mass  Renal/GU negative Renal ROS  negative genitourinary   Musculoskeletal negative musculoskeletal ROS (+)   Abdominal (+) + obese,   Peds  Hematology negative hematology ROS (+) hct 38.5   Anesthesia Other Findings   Reproductive/Obstetrics negative OB ROS Urine preg neg                            Anesthesia Physical Anesthesia Plan  ASA: 2  Anesthesia Plan: General   Post-op Pain Management:    Induction: Intravenous  PONV Risk Score and Plan: 4 or greater and Ondansetron, Dexamethasone, Midazolam, Scopolamine patch - Pre-op, Treatment may vary due to age or medical condition and Aprepitant  Airway Management Planned: Oral ETT  Additional Equipment: None  Intra-op Plan:   Post-operative Plan: Extubation in OR  Informed Consent: I have reviewed the patients History and Physical, chart, labs and discussed the procedure including the risks, benefits and alternatives for the proposed anesthesia with the patient or authorized representative who has indicated his/her understanding and acceptance.     Dental advisory given  Plan Discussed with: CRNA  Anesthesia Plan Comments:         Anesthesia Quick Evaluation

## 2021-05-19 ENCOUNTER — Ambulatory Visit (HOSPITAL_COMMUNITY): Payer: 59 | Admitting: Anesthesiology

## 2021-05-19 ENCOUNTER — Observation Stay (HOSPITAL_COMMUNITY)
Admission: RE | Admit: 2021-05-19 | Discharge: 2021-05-20 | Disposition: A | Discharge status: Home / Self Care | Payer: 59 | Attending: Otolaryngology | Admitting: Otolaryngology

## 2021-05-19 ENCOUNTER — Encounter (HOSPITAL_COMMUNITY): Admission: RE | Payer: Self-pay | Source: Home / Self Care

## 2021-05-19 ENCOUNTER — Encounter (HOSPITAL_COMMUNITY)
Admission: RE | Disposition: A | Discharge status: Home / Self Care | Payer: Self-pay | Source: Home / Self Care | Attending: Otolaryngology

## 2021-05-19 ENCOUNTER — Encounter (HOSPITAL_COMMUNITY): Payer: Self-pay | Admitting: Otolaryngology

## 2021-05-19 ENCOUNTER — Other Ambulatory Visit: Payer: Self-pay

## 2021-05-19 DIAGNOSIS — E041 Nontoxic single thyroid nodule: Secondary | ICD-10-CM | POA: Diagnosis present

## 2021-05-19 DIAGNOSIS — E890 Postprocedural hypothyroidism: Secondary | ICD-10-CM | POA: Diagnosis present

## 2021-05-19 DIAGNOSIS — E559 Vitamin D deficiency, unspecified: Secondary | ICD-10-CM | POA: Diagnosis not present

## 2021-05-19 DIAGNOSIS — R7303 Prediabetes: Secondary | ICD-10-CM | POA: Insufficient documentation

## 2021-05-19 DIAGNOSIS — Z7984 Long term (current) use of oral hypoglycemic drugs: Secondary | ICD-10-CM | POA: Insufficient documentation

## 2021-05-19 DIAGNOSIS — D34 Benign neoplasm of thyroid gland: Secondary | ICD-10-CM | POA: Diagnosis not present

## 2021-05-19 HISTORY — PX: THYROIDECTOMY: SHX17

## 2021-05-19 LAB — GLUCOSE, CAPILLARY
Glucose-Capillary: 100 mg/dL — ABNORMAL HIGH (ref 70–99)
Glucose-Capillary: 107 mg/dL — ABNORMAL HIGH (ref 70–99)
Glucose-Capillary: 127 mg/dL — ABNORMAL HIGH (ref 70–99)
Glucose-Capillary: 151 mg/dL — ABNORMAL HIGH (ref 70–99)

## 2021-05-19 LAB — POCT PREGNANCY, URINE: Preg Test, Ur: NEGATIVE

## 2021-05-19 SURGERY — THYROIDECTOMY
Anesthesia: General | Laterality: Left

## 2021-05-19 MED ORDER — POTASSIUM CHLORIDE IN NACL 20-0.45 MEQ/L-% IV SOLN
INTRAVENOUS | Status: DC
  Filled 2021-05-19 (×2): qty 1000

## 2021-05-19 MED ORDER — CEFAZOLIN SODIUM-DEXTROSE 2-4 GM/100ML-% IV SOLN
INTRAVENOUS | Status: AC
  Filled 2021-05-19: qty 100

## 2021-05-19 MED ORDER — LACTATED RINGERS IV SOLN: INTRAVENOUS | Status: DC

## 2021-05-19 MED ORDER — KETOROLAC TROMETHAMINE 30 MG/ML IJ SOLN: 30.0000 mg | Freq: Once | INTRAMUSCULAR | Status: DC | PRN

## 2021-05-19 MED ORDER — MIDAZOLAM HCL 2 MG/2ML IJ SOLN
INTRAMUSCULAR | Status: AC
  Filled 2021-05-19: qty 2

## 2021-05-19 MED ORDER — ACETAMINOPHEN 500 MG PO TABS
ORAL_TABLET | ORAL | Status: AC
  Administered 2021-05-19: 1000 mg via ORAL
  Filled 2021-05-19: qty 2

## 2021-05-19 MED ORDER — 0.9 % SODIUM CHLORIDE (POUR BTL) OPTIME
TOPICAL | Status: DC | PRN
  Administered 2021-05-19: 1000 mL

## 2021-05-19 MED ORDER — MIDAZOLAM HCL 5 MG/5ML IJ SOLN
INTRAMUSCULAR | Status: DC | PRN
  Administered 2021-05-19: 2 mg via INTRAVENOUS

## 2021-05-19 MED ORDER — ACETAMINOPHEN 500 MG PO TABS: 1000.0000 mg | ORAL_TABLET | Freq: Once | ORAL | Status: AC

## 2021-05-19 MED ORDER — SUGAMMADEX SODIUM 200 MG/2ML IV SOLN
INTRAVENOUS | Status: DC | PRN
  Administered 2021-05-19: 400 mg via INTRAVENOUS

## 2021-05-19 MED ORDER — FENTANYL CITRATE (PF) 100 MCG/2ML IJ SOLN
INTRAMUSCULAR | Status: DC | PRN
  Administered 2021-05-19 (×2): 100 ug via INTRAVENOUS
  Administered 2021-05-19: 50 ug via INTRAVENOUS

## 2021-05-19 MED ORDER — METFORMIN HCL 500 MG PO TABS
500.0000 mg | ORAL_TABLET | Freq: Every day | ORAL | Status: DC
  Administered 2021-05-20: 500 mg via ORAL
  Filled 2021-05-19 (×2): qty 1

## 2021-05-19 MED ORDER — OXYCODONE HCL 5 MG PO TABS: 5.0000 mg | ORAL_TABLET | Freq: Once | ORAL | Status: DC | PRN

## 2021-05-19 MED ORDER — CEFAZOLIN SODIUM-DEXTROSE 2-4 GM/100ML-% IV SOLN
2.0000 g | INTRAVENOUS | Status: AC
  Administered 2021-05-19: 2 g via INTRAVENOUS

## 2021-05-19 MED ORDER — CHLORHEXIDINE GLUCONATE 0.12 % MT SOLN
OROMUCOSAL | Status: AC
  Administered 2021-05-19: 15 mL via OROMUCOSAL
  Filled 2021-05-19: qty 15

## 2021-05-19 MED ORDER — ORAL CARE MOUTH RINSE: 15.0000 mL | Freq: Once | OROMUCOSAL | Status: AC

## 2021-05-19 MED ORDER — OXYCODONE HCL 5 MG/5ML PO SOLN: 5.0000 mg | Freq: Once | ORAL | Status: DC | PRN

## 2021-05-19 MED ORDER — ONDANSETRON HCL 4 MG/2ML IJ SOLN: 4.0000 mg | INTRAMUSCULAR | Status: DC | PRN

## 2021-05-19 MED ORDER — DEXAMETHASONE SODIUM PHOSPHATE 10 MG/ML IJ SOLN
INTRAMUSCULAR | Status: DC | PRN
  Administered 2021-05-19: 10 mg via INTRAVENOUS

## 2021-05-19 MED ORDER — APREPITANT 40 MG PO CAPS: 40.0000 mg | ORAL_CAPSULE | Freq: Once | ORAL | Status: AC

## 2021-05-19 MED ORDER — PROPOFOL 10 MG/ML IV BOLUS
INTRAVENOUS | Status: DC | PRN
  Administered 2021-05-19: 200 mg via INTRAVENOUS

## 2021-05-19 MED ORDER — HEMOSTATIC AGENTS (NO CHARGE) OPTIME
TOPICAL | Status: DC | PRN
  Administered 2021-05-19: 1 via TOPICAL

## 2021-05-19 MED ORDER — INSULIN ASPART 100 UNIT/ML IJ SOLN: 0.0000 [IU] | Freq: Three times a day (TID) | INTRAMUSCULAR | Status: DC

## 2021-05-19 MED ORDER — ONDANSETRON HCL 4 MG/2ML IJ SOLN
INTRAMUSCULAR | Status: DC | PRN
  Administered 2021-05-19: 4 mg via INTRAVENOUS

## 2021-05-19 MED ORDER — HYDROMORPHONE HCL 1 MG/ML IJ SOLN
0.2500 mg | INTRAMUSCULAR | Status: DC | PRN
  Administered 2021-05-19: 0.5 mg via INTRAVENOUS

## 2021-05-19 MED ORDER — APREPITANT 40 MG PO CAPS
ORAL_CAPSULE | ORAL | Status: AC
  Administered 2021-05-19: 40 mg via ORAL
  Filled 2021-05-19: qty 1

## 2021-05-19 MED ORDER — PROMETHAZINE HCL 25 MG/ML IJ SOLN
INTRAMUSCULAR | Status: AC
  Filled 2021-05-19: qty 1

## 2021-05-19 MED ORDER — HYDROMORPHONE HCL 1 MG/ML IJ SOLN
INTRAMUSCULAR | Status: AC
  Filled 2021-05-19: qty 1

## 2021-05-19 MED ORDER — PROPOFOL 10 MG/ML IV BOLUS
INTRAVENOUS | Status: AC
  Filled 2021-05-19: qty 20

## 2021-05-19 MED ORDER — SCOPOLAMINE 1 MG/3DAYS TD PT72: 1.0000 | MEDICATED_PATCH | TRANSDERMAL | Status: DC

## 2021-05-19 MED ORDER — MEPERIDINE HCL 25 MG/ML IJ SOLN: 6.2500 mg | INTRAMUSCULAR | Status: DC | PRN

## 2021-05-19 MED ORDER — HYDROCODONE-ACETAMINOPHEN 5-325 MG PO TABS: 1.0000 | ORAL_TABLET | ORAL | Status: DC | PRN

## 2021-05-19 MED ORDER — CHLORHEXIDINE GLUCONATE 0.12 % MT SOLN: 15.0000 mL | Freq: Once | OROMUCOSAL | Status: AC

## 2021-05-19 MED ORDER — PROMETHAZINE HCL 25 MG/ML IJ SOLN
6.2500 mg | INTRAMUSCULAR | Status: DC | PRN
  Administered 2021-05-19: 6.25 mg via INTRAVENOUS

## 2021-05-19 MED ORDER — SCOPOLAMINE 1 MG/3DAYS TD PT72
MEDICATED_PATCH | TRANSDERMAL | Status: AC
  Administered 2021-05-19: 1.5 mg via TRANSDERMAL
  Filled 2021-05-19: qty 1

## 2021-05-19 MED ORDER — FENTANYL CITRATE (PF) 250 MCG/5ML IJ SOLN
INTRAMUSCULAR | Status: AC
  Filled 2021-05-19: qty 5

## 2021-05-19 MED ORDER — ONDANSETRON HCL 4 MG PO TABS: 4.0000 mg | ORAL_TABLET | ORAL | Status: DC | PRN

## 2021-05-19 MED ORDER — LIDOCAINE-EPINEPHRINE 1 %-1:100000 IJ SOLN
INTRAMUSCULAR | Status: AC
  Filled 2021-05-19: qty 1

## 2021-05-19 MED ORDER — LIDOCAINE-EPINEPHRINE 1 %-1:100000 IJ SOLN
INTRAMUSCULAR | Status: DC | PRN
  Administered 2021-05-19: 4 mL

## 2021-05-19 MED ORDER — ROCURONIUM BROMIDE 10 MG/ML (PF) SYRINGE
PREFILLED_SYRINGE | INTRAVENOUS | Status: DC | PRN
  Administered 2021-05-19: 70 mg via INTRAVENOUS

## 2021-05-19 MED ORDER — MORPHINE SULFATE (PF) 2 MG/ML IV SOLN: 2.0000 mg | INTRAVENOUS | Status: DC | PRN

## 2021-05-19 MED ORDER — LIDOCAINE 2% (20 MG/ML) 5 ML SYRINGE
INTRAMUSCULAR | Status: DC | PRN
  Administered 2021-05-19: 60 mg via INTRAVENOUS

## 2021-05-19 SURGICAL SUPPLY — 44 items
BLADE SURG 15 STRL LF DISP TIS (BLADE) IMPLANT
BLADE SURG 15 STRL SS (BLADE)
CANISTER SUCT 3000ML PPV (MISCELLANEOUS) ×2 IMPLANT
CLEANER TIP ELECTROSURG 2X2 (MISCELLANEOUS) ×2 IMPLANT
CNTNR URN SCR LID CUP LEK RST (MISCELLANEOUS) IMPLANT
CONT SPEC 4OZ STRL OR WHT (MISCELLANEOUS)
CORD BIPOLAR FORCEPS 12FT (ELECTRODE) ×2 IMPLANT
COVER SURGICAL LIGHT HANDLE (MISCELLANEOUS) ×2 IMPLANT
COVER WAND RF STERILE (DRAPES) ×2 IMPLANT
DERMABOND ADVANCED (GAUZE/BANDAGES/DRESSINGS) ×1
DERMABOND ADVANCED .7 DNX12 (GAUZE/BANDAGES/DRESSINGS) ×1 IMPLANT
DRAIN JACKSON RD 7FR 3/32 (WOUND CARE) IMPLANT
DRAIN SNY 10 ROU (WOUND CARE) IMPLANT
DRAPE HALF SHEET 40X57 (DRAPES) IMPLANT
ELECT COATED BLADE 2.86 ST (ELECTRODE) ×2 IMPLANT
ELECT REM PT RETURN 9FT ADLT (ELECTROSURGICAL) ×2
ELECTRODE REM PT RTRN 9FT ADLT (ELECTROSURGICAL) ×1 IMPLANT
EVACUATOR SILICONE 100CC (DRAIN) ×2 IMPLANT
FORCEPS BIPOLAR SPETZLER 8 1.0 (NEUROSURGERY SUPPLIES) ×2 IMPLANT
GAUZE 4X4 16PLY RFD (DISPOSABLE) ×2 IMPLANT
GLOVE SURG ENC MOIS LTX SZ7.5 (GLOVE) ×2 IMPLANT
GOWN STRL REUS W/ TWL LRG LVL3 (GOWN DISPOSABLE) ×2 IMPLANT
GOWN STRL REUS W/TWL LRG LVL3 (GOWN DISPOSABLE) ×2
HEMOSTAT SURGICEL 2X14 (HEMOSTASIS) ×2 IMPLANT
KIT BASIN OR (CUSTOM PROCEDURE TRAY) ×2 IMPLANT
KIT TURNOVER KIT B (KITS) ×2 IMPLANT
LOCATOR NERVE 3 VOLT (DISPOSABLE) IMPLANT
NEEDLE HYPO 25GX1X1/2 BEV (NEEDLE) ×2 IMPLANT
NS IRRIG 1000ML POUR BTL (IV SOLUTION) ×2 IMPLANT
PAD ARMBOARD 7.5X6 YLW CONV (MISCELLANEOUS) ×4 IMPLANT
PENCIL SMOKE EVACUATOR (MISCELLANEOUS) ×2 IMPLANT
POSITIONER HEAD DONUT 9IN (MISCELLANEOUS) IMPLANT
SHEARS HARMONIC 9CM CVD (BLADE) ×2 IMPLANT
SPONGE INTESTINAL PEANUT (DISPOSABLE) IMPLANT
STAPLER VISISTAT 35W (STAPLE) ×2 IMPLANT
SUT ETHILON 2 0 FS 18 (SUTURE) ×2 IMPLANT
SUT SILK 3 0 REEL (SUTURE) ×2 IMPLANT
SUT VIC AB 3-0 SH 27 (SUTURE) ×1
SUT VIC AB 3-0 SH 27X BRD (SUTURE) ×1 IMPLANT
SUT VICRYL 4-0 PS2 18IN ABS (SUTURE) ×2 IMPLANT
SYR BULB EAR ULCER 3OZ GRN STR (SYRINGE) ×2 IMPLANT
TOWEL GREEN STERILE FF (TOWEL DISPOSABLE) ×2 IMPLANT
TRAY ENT MC OR (CUSTOM PROCEDURE TRAY) ×2 IMPLANT
TUBE FEEDING 10FR FLEXIFLO (MISCELLANEOUS) IMPLANT

## 2021-05-19 NOTE — Op Note (Signed)
Preop diagnosis: Left thyroid nodule Postop diagnosis: same Procedure: Left thyroid lobectomy Surgeon: Redmond Baseman Assist: Sallee Provencal, PA, who was necessary to assist with retraction and closure Anesth: General and local with 1% lidocaine with 1:100,000 epinephrine Compl: None Findings: Left-sided thyroid nodule Description:  After discussing risks, benefits, and alternatives, the patient was brought to the operative suite and placed on the operative table in the supine position.  Anesthesia was induced and the patient was intubated by the anesthesia team without difficulty.  The eyes were taped closed and a shoulder roll was placed.  The thyroid incision was marked with a marking pen and injected with local anesthetic.  The neck was prepped and draped in sterile fashion.  The incision was made with a 15 blade scalpel and extended through the subcutaneous and platysma layers with Bovie electrocautery.  The midline raphe of the strap muscles was divided and strap muscles retracted over the left lobe.  Dissection was then performed around the lobe using the harmonic scalpel includig division of the superior and inferior pedicles and dissection around the lateral aspect.  The superior parathyroid gland was visualized and left in place.  Dissection continued around the deep aspect of the lobe while retracting the lobe.  The recurrent laryngeal nerve was readily visualized and kept safe.  Staying along the capsule, the dissection continued to Berry's ligament that was dissected.  The midline of the gland was then incised with the harmonic scalpel, allowing removal of the lobe.  It was passed to nursing for pathology.  Bleeding was controlled with Bipolar electrocautery after a Valsalva was given.  After irrigating copiously, a strip of Surgicell was laid in the depth of the wound.  The strap muscles were closed with a single 3-0 Vicryl suture.  The platysma layer was closed with 3-0 Vicryl in a simple, interrupted  fashion.  The subcutaneous layer was similarly closed with 4-0 Vicryl and the skin was closed with glue.  Drapes were removed and the patient was cleaned off.  She was returned to anesthesia for wake-up, was extubated, and moved to the recovery room in stable condition.

## 2021-05-19 NOTE — Transfer of Care (Signed)
Immediate Anesthesia Transfer of Care Note  Patient: Brandi Wise  Procedure(s) Performed: THYROID LOBECTOMY (Left)  Patient Location: PACU  Anesthesia Type:General  Level of Consciousness: awake, alert  and oriented  Airway & Oxygen Therapy: Patient Spontanous Breathing  Post-op Assessment: Report given to RN and Post -op Vital signs reviewed and stable  Post vital signs: Reviewed and stable  Last Vitals:  Vitals Value Taken Time  BP 132/86 05/19/21 1108  Temp    Pulse 96 05/19/21 1108  Resp 19 05/19/21 1108  SpO2 99 % 05/19/21 1108  Vitals shown include unvalidated device data.  Last Pain:  Vitals:   05/19/21 0805  TempSrc:   PainSc: 0-No pain      Patients Stated Pain Goal: 3 (18/33/58 2518)  Complications: No notable events documented.

## 2021-05-19 NOTE — H&P (Signed)
Brandi Wise is an 30 y.o. female.   Chief Complaint: Thyroid mass HPI: 30 year old with several months of left thyroid fullness.  Ultrasound demonstrated a 4.8 cm left-sided nodule.  FNA was benign.  Due to obstructive symptoms, she has elected to proceed with left thyroid lobectomy.  Past Medical History:  Diagnosis Date   Knee pain    Pre-diabetes     Past Surgical History:  Procedure Laterality Date   POLYPECTOMY      Family History  Adopted: Yes  Problem Relation Age of Onset   Drug abuse Mother    Social History:  reports that she has never smoked. She has never used smokeless tobacco. She reports current alcohol use. She reports that she does not use drugs.  Allergies: No Known Allergies  Medications Prior to Admission  Medication Sig Dispense Refill   metFORMIN (GLUCOPHAGE) 500 MG tablet TAKE 1 TABLET BY MOUTH EVERY DAY WITH BREAKFAST (Patient taking differently: Take 500 mg by mouth in the morning.) 30 tablet 0   Vitamin D, Ergocalciferol, (DRISDOL) 1.25 MG (50000 UNIT) CAPS capsule Take 1 capsule (50,000 Units total) by mouth every 7 (seven) days. (Patient taking differently: Take 50,000 Units by mouth every Friday.) 4 capsule 0    Results for orders placed or performed during the hospital encounter of 05/19/21 (from the past 48 hour(s))  Glucose, capillary     Status: Abnormal   Collection Time: 05/19/21  7:55 AM  Result Value Ref Range   Glucose-Capillary 100 (H) 70 - 99 mg/dL    Comment: Glucose reference range applies only to samples taken after fasting for at least 8 hours.  Pregnancy, urine POC     Status: None   Collection Time: 05/19/21  8:26 AM  Result Value Ref Range   Preg Test, Ur NEGATIVE NEGATIVE    Comment:        THE SENSITIVITY OF THIS METHODOLOGY IS >24 mIU/mL    No results found.  Review of Systems  All other systems reviewed and are negative.  Blood pressure 127/78, pulse 76, temperature 97.8 F (36.6 C), temperature source Oral,  resp. rate 18, height 5\' 5"  (1.651 m), weight 101.2 kg, last menstrual period 04/28/2021, SpO2 100 %, unknown if currently breastfeeding. Physical Exam Constitutional:      Appearance: Normal appearance. She is normal weight.  HENT:     Head: Normocephalic and atraumatic.     Right Ear: External ear normal.     Left Ear: External ear normal.     Nose: Nose normal.     Mouth/Throat:     Mouth: Mucous membranes are moist.     Pharynx: Oropharynx is clear.  Eyes:     Extraocular Movements: Extraocular movements intact.     Conjunctiva/sclera: Conjunctivae normal.     Pupils: Pupils are equal, round, and reactive to light.  Neck:     Comments: Left thyroid fullness Cardiovascular:     Rate and Rhythm: Normal rate.  Pulmonary:     Effort: Pulmonary effort is normal.  Musculoskeletal:     Cervical back: Normal range of motion.  Skin:    General: Skin is warm and dry.  Neurological:     General: No focal deficit present.     Mental Status: She is alert and oriented to person, place, and time. Mental status is at baseline.  Psychiatric:        Mood and Affect: Mood normal.        Behavior: Behavior normal.  Thought Content: Thought content normal.        Judgment: Judgment normal.     Assessment/Plan Left thyroid mass  To OR for left thyroid lobectomy.  Melida Quitter, MD 05/19/2021, 9:28 AM

## 2021-05-19 NOTE — Progress Notes (Signed)
   ENT Progress Note: s/p Procedure(s): THYROID LOBECTOMY   Subjective: Mild discomfort  Objective: Vital signs in last 24 hours: Temp:  [97.8 F (36.6 C)-98.5 F (36.9 C)] 98.5 F (36.9 C) (06/22 1644) Pulse Rate:  [60-92] 79 (06/22 1644) Resp:  [14-18] 18 (06/22 1644) BP: (118-132)/(78-88) 122/85 (06/22 1644) SpO2:  [96 %-100 %] 100 % (06/22 1644) Weight:  [101.2 kg] 101.2 kg (06/22 0757) Weight change:     Intake/Output from previous day: No intake/output data recorded. Intake/Output this shift: Total I/O In: 120 [P.O.:120] Out: 10 [Blood:10]  Labs: No results for input(s): WBC, HGB, HCT, PLT in the last 72 hours. No results for input(s): NA, K, CL, CO2, GLUCOSE, BUN, CALCIUM in the last 72 hours.  Invalid input(s): CREATININR  Studies/Results: No results found.   PHYSICAL EXAM: Inc intact without swelling or erythema Normal voice and airway   Assessment/Plan: O/N obs    Brandi Wise 05/19/2021, 5:16 PM

## 2021-05-19 NOTE — Plan of Care (Signed)

## 2021-05-19 NOTE — Brief Op Note (Signed)
05/19/2021  10:57 AM  PATIENT:  Brandi Wise  30 y.o. female  PRE-OPERATIVE DIAGNOSIS:  THYROID MASS  POST-OPERATIVE DIAGNOSIS:  same  PROCEDURE:  Procedure(s): THYROID LOBECTOMY (Left)  SURGEON:  Surgeon(s) and Role:    Melida Quitter, MD - Primary  PHYSICIAN ASSISTANT: Sallee Provencal  ASSISTANTS: none   ANESTHESIA:   general  EBL:  10 mL   BLOOD ADMINISTERED:none  DRAINS: none   LOCAL MEDICATIONS USED:  LIDOCAINE   SPECIMEN:  Source of Specimen:  Left thyroid lobe  DISPOSITION OF SPECIMEN:  PATHOLOGY  COUNTS:  YES  TOURNIQUET:  * No tourniquets in log *  DICTATION: .Note written in EPIC  PLAN OF CARE: Admit for overnight observation  PATIENT DISPOSITION:  PACU - hemodynamically stable.   Delay start of Pharmacological VTE agent (>24hrs) due to surgical blood loss or risk of bleeding: no

## 2021-05-19 NOTE — Anesthesia Procedure Notes (Signed)

## 2021-05-19 NOTE — Anesthesia Postprocedure Evaluation (Signed)
Anesthesia Post Note  Patient: Brandi Wise  Procedure(s) Performed: THYROID LOBECTOMY (Left)     Patient location during evaluation: PACU Anesthesia Type: General Level of consciousness: awake and alert, oriented and patient cooperative Pain management: pain level controlled Vital Signs Assessment: post-procedure vital signs reviewed and stable Respiratory status: spontaneous breathing, nonlabored ventilation and respiratory function stable Cardiovascular status: blood pressure returned to baseline and stable Postop Assessment: no apparent nausea or vomiting Anesthetic complications: no   No notable events documented.  Last Vitals:  Vitals:   05/19/21 1155 05/19/21 1253  BP: 118/86 118/83  Pulse: 60 70  Resp: 14 16  Temp: 36.7 C 36.6 C  SpO2: 97% 98%    Last Pain:  Vitals:   05/19/21 1253  TempSrc: Oral  PainSc:                  Pervis Hocking

## 2021-05-20 ENCOUNTER — Encounter (HOSPITAL_COMMUNITY): Payer: Self-pay | Admitting: Otolaryngology

## 2021-05-20 DIAGNOSIS — R7303 Prediabetes: Secondary | ICD-10-CM | POA: Diagnosis not present

## 2021-05-20 DIAGNOSIS — D34 Benign neoplasm of thyroid gland: Secondary | ICD-10-CM | POA: Diagnosis not present

## 2021-05-20 DIAGNOSIS — Z7984 Long term (current) use of oral hypoglycemic drugs: Secondary | ICD-10-CM | POA: Diagnosis not present

## 2021-05-20 LAB — SURGICAL PATHOLOGY

## 2021-05-20 LAB — GLUCOSE, CAPILLARY: Glucose-Capillary: 99 mg/dL (ref 70–99)

## 2021-05-20 MED ORDER — HYDROCODONE-ACETAMINOPHEN 5-325 MG PO TABS: 1.0000 | ORAL_TABLET | Freq: Four times a day (QID) | ORAL | 0 refills | Status: DC | PRN

## 2021-05-20 NOTE — Discharge Summary (Signed)
Physician Discharge Summary  Patient ID: Brandi Wise MRN: [id number] DOB/AGE: 1991/01/27 30 y.o.  Admit date: 05/19/2021 Discharge date: 05/20/2021  Admission Diagnoses: Thyroid nodule  Discharge Diagnoses:  Active Problems:   Thyroid nodule   Discharged Condition: good  Hospital Course: 30 year old with symptomatic left thyroid nodule presented for left lobectomy.  See operative note.  She was observed overnight and had no problems.  She is stable for discharge on POD 1.  Consults: None  Significant Diagnostic Studies: None  Treatments: surgery: Left thyroid lobectomy  Discharge Exam: Blood pressure 139/62, pulse 61, temperature 98.4 F (36.9 C), temperature source Oral, resp. rate 17, height 5\' 5"  (1.651 m), weight 101.2 kg, last menstrual period 04/28/2021, SpO2 99 %, unknown if currently breastfeeding. General appearance: alert, cooperative, and no distress Neck: thyroid incision clean and intact, no fluid collection, normal voice  Disposition: Discharge disposition: 01-Home or Self Care       Discharge Instructions     Diet - low sodium heart healthy   Complete by: As directed    Discharge instructions   Complete by: As directed    Avoid strenuous activity.  Resume normal diet.  OK to allow water on incision, gently pat dry.  Do not apply ointment to incision.   Increase activity slowly   Complete by: As directed    No dressing needed   Complete by: As directed       Allergies as of 05/20/2021   No Known Allergies      Medication List     TAKE these medications    HYDROcodone-acetaminophen 5-325 MG tablet Commonly known as: NORCO/VICODIN Take 1-2 tablets by mouth every 6 (six) hours as needed for moderate pain.   metFORMIN 500 MG tablet Commonly known as: GLUCOPHAGE TAKE 1 TABLET BY MOUTH EVERY DAY WITH BREAKFAST What changed: See the new instructions.   Vitamin D (Ergocalciferol) 1.25 MG (50000 UNIT) Caps capsule Commonly known as:  DRISDOL Take 1 capsule (50,000 Units total) by mouth every 7 (seven) days. What changed: when to take this               Discharge Care Instructions  (From admission, onward)           Start     Ordered   05/20/21 0000  No dressing needed        05/20/21 0818            Follow-up Information     Melida Quitter, MD. Schedule an appointment as soon as possible for a visit in 1 week(s).   Specialty: Otolaryngology Contact information: [address] [place] [address] [phone]                 Signed: Melida Quitter 05/20/2021, 8:18 AM

## 2021-05-20 NOTE — Plan of Care (Signed)
Pt ready for discharge to home accompanied by family. Pt given AVS and explained. DC instructions reviewed and pt given copy of DC instructions.  No questions verbalized about home self care.

## 2021-06-01 ENCOUNTER — Ambulatory Visit (INDEPENDENT_AMBULATORY_CARE_PROVIDER_SITE_OTHER): Payer: 59 | Admitting: Family Medicine

## 2021-06-01 ENCOUNTER — Encounter (INDEPENDENT_AMBULATORY_CARE_PROVIDER_SITE_OTHER): Payer: Self-pay | Admitting: Family Medicine

## 2021-06-01 ENCOUNTER — Other Ambulatory Visit: Payer: Self-pay

## 2021-06-01 VITALS — BP 123/85 | HR 74 | Temp 97.9°F | Ht 65.0 in | Wt 219.0 lb

## 2021-06-01 DIAGNOSIS — Z6837 Body mass index (BMI) 37.0-37.9, adult: Secondary | ICD-10-CM | POA: Diagnosis not present

## 2021-06-01 DIAGNOSIS — Z9189 Other specified personal risk factors, not elsewhere classified: Secondary | ICD-10-CM

## 2021-06-01 DIAGNOSIS — E559 Vitamin D deficiency, unspecified: Secondary | ICD-10-CM

## 2021-06-01 DIAGNOSIS — R7303 Prediabetes: Secondary | ICD-10-CM | POA: Diagnosis not present

## 2021-06-03 NOTE — Progress Notes (Signed)
Chief Complaint:   OBESITY Brandi Wise is here to discuss her progress with her obesity treatment plan along with follow-up of her obesity related diagnoses. Brandi Wise is on the Category 4 Plan and states she is following her eating plan approximately 50% of the time. [name] states she is not currently exercising.  Today's visit was #: 8 Starting weight: 223 lbs Starting date: 12/25/2020 Today's weight: 219 lbs Today's date: 06/01/2021 Total lbs lost to date: 4 Total lbs lost since last in-office visit: 2  Interim History: With recent thyroid surgery, Brandi Wise had some difficulty swallowing bulkier/harder foods. For the 4th of July, she had a cookout. She wants to get back into Crossfit very cautiously. She reports pain in her throat has decreased. No changes need to be made to plan.  Subjective:   1. Vitamin D deficiency Brandi Wise denies nausea, vomiting, and muscle weakness but notes fatigue. Pt is on prescription Vit D. Her last Vit D level was 17.8.  2. Pre-diabetes Brandi Wise's last A1c was 5.7 and insulin level 34.2. She is on Metformin daily.  3. At risk for osteoporosis Brandi Wise is at higher risk of osteopenia and osteoporosis due to Vitamin D deficiency.   Assessment/Plan:   1. Vitamin D deficiency Low Vitamin D level contributes to fatigue and are associated with obesity, breast, and colon cancer. She agrees to continue to take prescription Vitamin D @50 ,000 IU every week and will follow-up for routine testing of Vitamin D, at least 2-3 times per year to avoid over-replacement. Refill Vit D 50K IU weekly, #4, 0 RF.  2. Pre-diabetes Brandi Wise will continue to work on weight loss, exercise, and decreasing simple carbohydrates to help decrease the risk of diabetes. Continue current treatment plan.  3. At risk for osteoporosis Brandi Wise was given approximately 15 minutes of osteoporosis prevention counseling today. Brandi Wise is at risk for osteopenia and osteoporosis due to her Vitamin D  deficiency. She was encouraged to take her Vitamin D and follow her higher calcium diet and increase strengthening exercise to help strengthen her bones and decrease her risk of osteopenia and osteoporosis.  Repetitive spaced learning was employed today to elicit superior memory formation and behavioral change.  4. Class 2 severe obesity with serious comorbidity and body mass index (BMI) of 37.0 to 37.9 in adult, unspecified obesity type (HCC)  Brandi Wise is currently in the action stage of change. As such, her goal is to continue with weight loss efforts. She has agreed to the Category 4 Plan.   Exercise goals: All adults should avoid inactivity. Some physical activity is better than none, and adults who participate in any amount of physical activity gain some health benefits.  Behavioral modification strategies: increasing lean protein intake, meal planning and cooking strategies, keeping healthy foods in the home, and planning for success.  Brandi Wise has agreed to follow-up with our clinic in 2-3 weeks. She was informed of the importance of frequent follow-up visits to maximize her success with intensive lifestyle modifications for her multiple health conditions.   Objective:   Blood pressure 123/85, pulse 74, temperature 97.9 F (36.6 C), height 5\' 5"  (1.651 m), weight 219 lb (99.3 kg), SpO2 98 %, unknown if currently breastfeeding. Body mass index is 36.44 kg/m.  General: Cooperative, alert, well developed, in no acute distress. HEENT: Conjunctivae and lids unremarkable. Cardiovascular: Regular rhythm.  Lungs: Normal work of breathing. Neurologic: No focal deficits.   Lab Results  Component Value Date   CREATININE 0.84 05/12/2021   BUN 16 05/12/2021  NA 136 05/12/2021   K 4.1 05/12/2021   CL 105 05/12/2021   CO2 23 05/12/2021   Lab Results  Component Value Date   ALT 12 12/25/2020   AST 15 12/25/2020   ALKPHOS 84 12/25/2020   BILITOT 0.2 12/25/2020   Lab Results   Component Value Date   HGBA1C 5.7 (H) 12/25/2020   Lab Results  Component Value Date   INSULIN 34.2 (H) 12/25/2020   Lab Results  Component Value Date   TSH 0.567 12/25/2020   Lab Results  Component Value Date   CHOL 160 12/25/2020   HDL 54 12/25/2020   LDLCALC 97 12/25/2020   TRIG 39 12/25/2020   Lab Results  Component Value Date   VD25OH 17.9 (L) 12/25/2020   Lab Results  Component Value Date   WBC 9.6 05/12/2021   HGB 12.5 05/12/2021   HCT 38.5 05/12/2021   MCV 90.4 05/12/2021   PLT 415 (H) 05/12/2021    Attestation Statements:   Reviewed by clinician on day of visit: allergies, medications, problem list, medical history, surgical history, family history, social history, and previous encounter notes.  Coral Ceo, CMA, am acting as transcriptionist for Coralie Common, MD.  I have reviewed the above documentation for accuracy and completeness, and I agree with the above. - Jinny Blossom, MD

## 2021-06-17 ENCOUNTER — Encounter (INDEPENDENT_AMBULATORY_CARE_PROVIDER_SITE_OTHER): Payer: Self-pay | Admitting: Family Medicine

## 2021-06-17 ENCOUNTER — Other Ambulatory Visit: Payer: Self-pay

## 2021-06-17 ENCOUNTER — Ambulatory Visit (INDEPENDENT_AMBULATORY_CARE_PROVIDER_SITE_OTHER): Payer: 59 | Admitting: Family Medicine

## 2021-06-17 VITALS — BP 112/77 | HR 73 | Temp 98.2°F | Ht 65.0 in | Wt 218.0 lb

## 2021-06-17 DIAGNOSIS — Z9189 Other specified personal risk factors, not elsewhere classified: Secondary | ICD-10-CM

## 2021-06-17 DIAGNOSIS — E559 Vitamin D deficiency, unspecified: Secondary | ICD-10-CM | POA: Diagnosis not present

## 2021-06-17 DIAGNOSIS — R7303 Prediabetes: Secondary | ICD-10-CM

## 2021-06-17 DIAGNOSIS — Z6838 Body mass index (BMI) 38.0-38.9, adult: Secondary | ICD-10-CM | POA: Diagnosis not present

## 2021-06-17 MED ORDER — METFORMIN HCL 500 MG PO TABS: 500.0000 mg | ORAL_TABLET | Freq: Every day | ORAL | 0 refills | Status: DC

## 2021-06-17 MED ORDER — VITAMIN D (ERGOCALCIFEROL) 1.25 MG (50000 UNIT) PO CAPS: 50000.0000 [IU] | ORAL_CAPSULE | ORAL | 0 refills | Status: DC

## 2021-06-18 LAB — COMPREHENSIVE METABOLIC PANEL
ALT: 12 IU/L (ref 0–32)
AST: 26 IU/L (ref 0–40)
Albumin/Globulin Ratio: 1.1 — ABNORMAL LOW (ref 1.2–2.2)
Albumin: 3.9 g/dL (ref 3.8–4.8)
Alkaline Phosphatase: 64 IU/L (ref 44–121)
BUN/Creatinine Ratio: 15 (ref 9–23)
BUN: 12 mg/dL (ref 6–20)
Bilirubin Total: 0.3 mg/dL (ref 0.0–1.2)
CO2: 22 mmol/L (ref 20–29)
Calcium: 8.7 mg/dL (ref 8.7–10.2)
Chloride: 101 mmol/L (ref 96–106)
Creatinine, Ser: 0.81 mg/dL (ref 0.57–1.00)
Globulin, Total: 3.5 g/dL (ref 1.5–4.5)
Glucose: 78 mg/dL (ref 65–99)
Potassium: 4.7 mmol/L (ref 3.5–5.2)
Sodium: 136 mmol/L (ref 134–144)
Total Protein: 7.4 g/dL (ref 6.0–8.5)
eGFR: 99 mL/min/{1.73_m2} (ref >59)

## 2021-06-18 LAB — VITAMIN D 25 HYDROXY (VIT D DEFICIENCY, FRACTURES): Vit D, 25-Hydroxy: 31.6 ng/mL (ref 30.0–100.0)

## 2021-06-18 LAB — HEMOGLOBIN A1C
Est. average glucose Bld gHb Est-mCnc: 120 mg/dL
Hgb A1c MFr Bld: 5.8 % — ABNORMAL HIGH (ref 4.8–5.6)

## 2021-06-18 LAB — INSULIN, RANDOM: INSULIN: 8.5 u[IU]/mL (ref 2.6–24.9)

## 2021-06-22 NOTE — Progress Notes (Signed)
Chief Complaint:   OBESITY Brandi Wise is here to discuss her progress with her obesity treatment plan along with follow-up of her obesity related diagnoses. Brandi Wise is on the Category 4 Plan and states she is following her eating plan approximately 95% of the time. Brandi Wise states she is doing Crossfit 60 minutes 4 times per week.  Today's visit was #: 9 Starting weight: 223 lbs Starting date: 12/25/2020 Today's weight: 218 lbs Today's date: 06/17/2021 Total lbs lost to date: 5 Total lbs lost since last in-office visit: 1  Interim History: Brandi Wise has been working doubles over the last few weeks. She denies hunger or cravings. She is doing yogurt, granola bars, and protein shake at dinner. She gets 8 oz protein at dinner. She is going to [place] on vacation for 1 week and staying in a beach house.  Subjective:   1. Pre-diabetes Brandi Wise's last A1c was 5.7 and insulin level 34.2. She is not consistently taking Metformin.  2. Vitamin D deficiency She denies nausea, vomiting, and muscle weakness but notes fatigue. Pt is on prescription Vit D. Her last Vit D level was 17.9.  3. At risk for medication nonadherence Brandi Wise is at risk for medication non-adherence due to not consistently taking medications.  Assessment/Plan:   1. Pre-diabetes Brandi Wise will continue to work on weight loss, exercise, and decreasing simple carbohydrates to help decrease the risk of diabetes.  Check labs today.  Refill- metFORMIN (GLUCOPHAGE) 500 MG tablet; Take 1 tablet (500 mg total) by mouth daily with breakfast.  Dispense: 30 tablet; Refill: 0  - Hemoglobin A1c - Insulin, random - Comprehensive metabolic panel  2. Vitamin D deficiency Low Vitamin D level contributes to fatigue and are associated with obesity, breast, and colon cancer. She agrees to continue to take prescription Vitamin D '@50'$ ,000 IU every week and will follow-up for routine testing of Vitamin D, at least 2-3 times per year to avoid  over-replacement. Check labs today.  Refill- Vitamin D, Ergocalciferol, (DRISDOL) 1.25 MG (50000 UNIT) CAPS capsule; Take 1 capsule (50,000 Units total) by mouth every 7 (seven) days.  Dispense: 4 capsule; Refill: 0  - VITAMIN D 25 Hydroxy (Vit-D Deficiency, Fractures)  3. At risk for medication nonadherence Brandi Wise was given approximately 15 minutes of counseling today to help her avoid medication non-adherence.  We discussed importance of taking medications at a similar time each day and the use of daily pill organizers to help improve medication adherence.  Repetitive spaced learning was employed today to elicit superior memory formation and behavioral change.  4. Obesity Current BMI 36  Brandi Wise is currently in the action stage of change. As such, her goal is to continue with weight loss efforts. She has agreed to the Category 4 Plan and practicing portion control and making smarter food choices, such as increasing vegetables and decreasing simple carbohydrates.   Exercise goals:  As is  Behavioral modification strategies: increasing lean protein intake, meal planning and cooking strategies, keeping healthy foods in the home, and planning for success.  Brandi Wise has agreed to follow-up with our clinic in 3 weeks. She was informed of the importance of frequent follow-up visits to maximize her success with intensive lifestyle modifications for her multiple health conditions.   Brandi Wise was informed we would discuss her lab results at her next visit unless there is a critical issue that needs to be addressed sooner. Brandi Wise agreed to keep her next visit at the agreed upon time to discuss these results.  Objective:  Blood pressure 112/77, pulse 73, temperature 98.2 F (36.8 C), height '5\' 5"'$  (1.651 m), weight 218 lb (98.9 kg), SpO2 96 %, unknown if currently breastfeeding. Body mass index is 36.28 kg/m.  General: Cooperative, alert, well developed, in no acute distress. HEENT:  Conjunctivae and lids unremarkable. Cardiovascular: Regular rhythm.  Lungs: Normal work of breathing. Neurologic: No focal deficits.   Lab Results  Component Value Date   CREATININE 0.81 06/17/2021   BUN 12 06/17/2021   NA 136 06/17/2021   K 4.7 06/17/2021   CL 101 06/17/2021   CO2 22 06/17/2021   Lab Results  Component Value Date   ALT 12 06/17/2021   AST 26 06/17/2021   ALKPHOS 64 06/17/2021   BILITOT 0.3 06/17/2021   Lab Results  Component Value Date   HGBA1C 5.8 (H) 06/17/2021   HGBA1C 5.7 (H) 12/25/2020   Lab Results  Component Value Date   INSULIN 8.5 06/17/2021   INSULIN 34.2 (H) 12/25/2020   Lab Results  Component Value Date   TSH 0.567 12/25/2020   Lab Results  Component Value Date   CHOL 160 12/25/2020   HDL 54 12/25/2020   LDLCALC 97 12/25/2020   TRIG 39 12/25/2020   Lab Results  Component Value Date   VD25OH 31.6 06/17/2021   VD25OH 17.9 (L) 12/25/2020   Lab Results  Component Value Date   WBC 9.6 05/12/2021   HGB 12.5 05/12/2021   HCT 38.5 05/12/2021   MCV 90.4 05/12/2021   PLT 415 (H) 05/12/2021   No results found for: IRON, TIBC, FERRITIN   Attestation Statements:   Reviewed by clinician on day of visit: allergies, medications, problem list, medical history, surgical history, family history, social history, and previous encounter notes.  Coral Ceo, CMA, am acting as transcriptionist for Coralie Common, MD.   I have reviewed the above documentation for accuracy and completeness, and I agree with the above. - Coralie Common, MD

## 2021-07-01 ENCOUNTER — Other Ambulatory Visit (INDEPENDENT_AMBULATORY_CARE_PROVIDER_SITE_OTHER): Payer: Self-pay | Admitting: Family Medicine

## 2021-07-01 DIAGNOSIS — R7303 Prediabetes: Secondary | ICD-10-CM

## 2021-07-07 ENCOUNTER — Ambulatory Visit (INDEPENDENT_AMBULATORY_CARE_PROVIDER_SITE_OTHER): Payer: 59 | Admitting: Family Medicine

## 2021-07-08 ENCOUNTER — Encounter (INDEPENDENT_AMBULATORY_CARE_PROVIDER_SITE_OTHER): Payer: Self-pay | Admitting: Family Medicine

## 2021-07-08 ENCOUNTER — Other Ambulatory Visit: Payer: Self-pay

## 2021-07-08 ENCOUNTER — Ambulatory Visit (INDEPENDENT_AMBULATORY_CARE_PROVIDER_SITE_OTHER): Payer: 59 | Admitting: Family Medicine

## 2021-07-08 VITALS — BP 105/74 | HR 67 | Temp 97.9°F | Ht 65.0 in | Wt 219.0 lb

## 2021-07-08 DIAGNOSIS — E559 Vitamin D deficiency, unspecified: Secondary | ICD-10-CM

## 2021-07-08 DIAGNOSIS — Z6838 Body mass index (BMI) 38.0-38.9, adult: Secondary | ICD-10-CM

## 2021-07-08 DIAGNOSIS — R7303 Prediabetes: Secondary | ICD-10-CM | POA: Diagnosis not present

## 2021-07-08 NOTE — Patient Instructions (Signed)
Health Maintenance Due  Topic Date Due   HIV Screening  Never done   Hepatitis C Screening  Never done   TETANUS/TDAP  Never done   PAP SMEAR-Modifier  Never done   COVID-19 Vaccine (3 - Booster for Pfizer series) 07/25/2020   INFLUENZA VACCINE  06/28/2021    Depression screen PHQ 2/9 12/25/2020  Decreased Interest 3  Down, Depressed, Hopeless 1  PHQ - 2 Score 4  Altered sleeping 0  Tired, decreased energy 1  Change in appetite 1  Feeling bad or failure about yourself  1  Trouble concentrating 2  Moving slowly or fidgety/restless 0  Suicidal thoughts 0  PHQ-9 Score 9  Difficult doing work/chores Somewhat difficult

## 2021-07-09 NOTE — Progress Notes (Signed)
Chief Complaint:   OBESITY Brandi Wise is here to discuss her progress with her obesity treatment plan along with follow-up of her obesity related diagnoses. Brandi Wise is on the Category 4 Plan and states she is following her eating plan approximately 90% of the time. Brandi Wise states she is swimming and doing CrossFit for 60 minutes 5 times per week.  Today's visit was #: 10 Starting weight: 223 lbs Starting date: 12/25/2020 Today's weight: 219 lbs Today's date: 07/08/2021 Total lbs lost to date: 4 Total lbs lost since last in-office visit: 0  Interim History: Brandi Wise went on vacation last week to [place], in which she went to [place]. She got back on the plan when she returned home. Brandi Wise denies hunger and cravings. She does not have plans for the next 2 weeks or Labor Day weekend.  Subjective:   1. Prediabetes Brandi Wise has a diagnosis of prediabetes based on her elevated HgA1c. She is on metformin and denies GI side effects. Brandi Wise is planning for a baby at some point. She was informed this puts her at greater risk of developing diabetes. She continues to work on diet and exercise to decrease her risk of diabetes. She denies nausea or hypoglycemia.  Lab Results  Component Value Date   HGBA1C 5.8 (H) 06/17/2021   Lab Results  Component Value Date   INSULIN 8.5 06/17/2021   INSULIN 34.2 (H) 12/25/2020   2. Vitamin D deficiency She is currently taking prescription vitamin D 50,000 IU each week. She admits fatigue and denies nausea, vomiting or muscle weakness.  Lab Results  Component Value Date   VD25OH 31.6 06/17/2021   VD25OH 17.9 (L) 12/25/2020    Assessment/Plan:   1. Prediabetes Brandi Wise will continue to take metformin and will work on weight loss, exercise, and decreasing simple carbohydrates to help decrease the risk of diabetes.   2. Vitamin D deficiency Low Vitamin D level contributes to fatigue and are associated with obesity, breast, and colon cancer. She  agrees to continue to take prescription Vitamin D '@50'$ ,000 IU every week with no refill needed and will follow-up for routine testing of Vitamin D, at least 2-3 times per year to avoid over-replacement.  3. Obesity Current BMI 36.5 Brandi Wise is currently in the action stage of change. As such, her goal is to continue with weight loss efforts. She has agreed to the Category 4 Plan.   Exercise goals:  As is.  Behavioral modification strategies: increasing lean protein intake, meal planning and cooking strategies, keeping healthy foods in the home, and planning for success.  Brandi Wise has agreed to follow-up with our clinic in 4 weeks. She was informed of the importance of frequent follow-up visits to maximize her success with intensive lifestyle modifications for her multiple health conditions.   Objective:   Blood pressure 105/74, pulse 67, temperature 97.9 F (36.6 C), height '5\' 5"'$  (1.651 m), weight 219 lb (99.3 kg), SpO2 99 %, unknown if currently breastfeeding. Body mass index is 36.44 kg/m.  General: Cooperative, alert, well developed, in no acute distress. HEENT: Conjunctivae and lids unremarkable. Cardiovascular: Regular rhythm.  Lungs: Normal work of breathing. Neurologic: No focal deficits.   Lab Results  Component Value Date   CREATININE 0.81 06/17/2021   BUN 12 06/17/2021   NA 136 06/17/2021   K 4.7 06/17/2021   CL 101 06/17/2021   CO2 22 06/17/2021   Lab Results  Component Value Date   ALT 12 06/17/2021   AST 26 06/17/2021   ALKPHOS  64 06/17/2021   BILITOT 0.3 06/17/2021   Lab Results  Component Value Date   HGBA1C 5.8 (H) 06/17/2021   HGBA1C 5.7 (H) 12/25/2020   Lab Results  Component Value Date   INSULIN 8.5 06/17/2021   INSULIN 34.2 (H) 12/25/2020   Lab Results  Component Value Date   TSH 0.567 12/25/2020   Lab Results  Component Value Date   CHOL 160 12/25/2020   HDL 54 12/25/2020   LDLCALC 97 12/25/2020   TRIG 39 12/25/2020   Lab Results   Component Value Date   VD25OH 31.6 06/17/2021   VD25OH 17.9 (L) 12/25/2020   Lab Results  Component Value Date   WBC 9.6 05/12/2021   HGB 12.5 05/12/2021   HCT 38.5 05/12/2021   MCV 90.4 05/12/2021   PLT 415 (H) 05/12/2021   No results found for: IRON, TIBC, FERRITIN  Attestation Statements:   Reviewed by clinician on day of visit: allergies, medications, problem list, medical history, surgical history, family history, social history, and previous encounter notes.  Time spent on visit including pre-visit chart review and post-visit care and charting was 15 minutes.   IMarcille Blanco, CMA, am acting as transcriptionist for Coralie Common, MD   I have reviewed the above documentation for accuracy and completeness, and I agree with the above. - Coralie Common, MD

## 2021-07-21 DIAGNOSIS — Z01419 Encounter for gynecological examination (general) (routine) without abnormal findings: Secondary | ICD-10-CM | POA: Diagnosis not present

## 2021-07-21 DIAGNOSIS — Z1159 Encounter for screening for other viral diseases: Secondary | ICD-10-CM | POA: Diagnosis not present

## 2021-07-21 DIAGNOSIS — N96 Recurrent pregnancy loss: Secondary | ICD-10-CM | POA: Diagnosis not present

## 2021-07-21 DIAGNOSIS — Z114 Encounter for screening for human immunodeficiency virus [HIV]: Secondary | ICD-10-CM | POA: Diagnosis not present

## 2021-07-21 DIAGNOSIS — R300 Dysuria: Secondary | ICD-10-CM | POA: Diagnosis not present

## 2021-07-21 DIAGNOSIS — Z113 Encounter for screening for infections with a predominantly sexual mode of transmission: Secondary | ICD-10-CM | POA: Diagnosis not present

## 2021-07-23 DIAGNOSIS — N96 Recurrent pregnancy loss: Secondary | ICD-10-CM | POA: Diagnosis not present

## 2021-08-03 ENCOUNTER — Ambulatory Visit (INDEPENDENT_AMBULATORY_CARE_PROVIDER_SITE_OTHER): Payer: 59 | Admitting: Family Medicine

## 2021-08-03 DIAGNOSIS — Z3141 Encounter for fertility testing: Secondary | ICD-10-CM | POA: Diagnosis not present

## 2021-08-03 DIAGNOSIS — Z3202 Encounter for pregnancy test, result negative: Secondary | ICD-10-CM | POA: Diagnosis not present

## 2021-08-04 ENCOUNTER — Encounter (INDEPENDENT_AMBULATORY_CARE_PROVIDER_SITE_OTHER): Payer: Self-pay | Admitting: Family Medicine

## 2021-08-04 ENCOUNTER — Other Ambulatory Visit: Payer: Self-pay

## 2021-08-04 ENCOUNTER — Ambulatory Visit (INDEPENDENT_AMBULATORY_CARE_PROVIDER_SITE_OTHER): Payer: 59 | Admitting: Family Medicine

## 2021-08-04 VITALS — BP 112/72 | HR 68 | Temp 97.4°F | Ht 65.0 in | Wt 215.0 lb

## 2021-08-04 DIAGNOSIS — E559 Vitamin D deficiency, unspecified: Secondary | ICD-10-CM | POA: Diagnosis not present

## 2021-08-04 DIAGNOSIS — Z6837 Body mass index (BMI) 37.0-37.9, adult: Secondary | ICD-10-CM | POA: Diagnosis not present

## 2021-08-04 DIAGNOSIS — Z9189 Other specified personal risk factors, not elsewhere classified: Secondary | ICD-10-CM

## 2021-08-04 DIAGNOSIS — R7303 Prediabetes: Secondary | ICD-10-CM | POA: Diagnosis not present

## 2021-08-04 MED ORDER — METFORMIN HCL 500 MG PO TABS: 500.0000 mg | ORAL_TABLET | Freq: Every day | ORAL | 0 refills | Status: DC

## 2021-08-04 MED ORDER — VITAMIN D (ERGOCALCIFEROL) 1.25 MG (50000 UNIT) PO CAPS: 50000.0000 [IU] | ORAL_CAPSULE | ORAL | 0 refills | Status: DC

## 2021-08-05 NOTE — Progress Notes (Signed)
Chief Complaint:   OBESITY Brandi Wise is here to discuss her progress with her obesity treatment plan along with follow-up of her obesity related diagnoses. Brandi Wise is on the Category 4 Plan and states she is following her eating plan approximately 90% of the time. Brandi Wise states she is swimming and doing Crossfit 60 minutes 1-5 times per week.  Today's visit was #: 11 Starting weight: 223 lbs Starting date: 12/25/2020 Today's weight: 215 lbs Today's date: 08/04/2021 Total lbs lost to date: 8 Total lbs lost since last in-office visit: 4  Interim History: Brandi Wise is planning to go to [place] in 2 weeks for a little over a week. Otherwise, she is home until her trip. She has a few tours booked. Pt has been more mindful of sugar intact. Meat is the hardest to eat all of (able to get 6-8 oz meat in). Pt's goal is to maintain over the next few weeks.  Subjective:   1. Vitamin D deficiency Brandi Wise denies nausea, vomiting, and muscle weakness but notes fatigue. Pt is on prescription Vit D.  2. Pre-diabetes She is on Metformin with no GI side effects.  3. At increased risk of exposure to COVID-19 virus Brandi Wise is at increased risk of exposure to COVID-19 virus due to traveling internationally.  Assessment/Plan:   1. Vitamin D deficiency Low Vitamin D level contributes to fatigue and are associated with obesity, breast, and colon cancer. She agrees to continue to take prescription Vitamin D 50,000 IU every week and will follow-up for routine testing of Vitamin D, at least 2-3 times per year to avoid over-replacement.  Refill- Vitamin D, Ergocalciferol, (DRISDOL) 1.25 MG (50000 UNIT) CAPS capsule; Take 1 capsule (50,000 Units total) by mouth every 7 (seven) days.  Dispense: 4 capsule; Refill: 0  2. Pre-diabetes Brandi Wise will continue to work on weight loss, exercise, and decreasing simple carbohydrates to help decrease the risk of diabetes.   Refill- metFORMIN (GLUCOPHAGE) 500 MG tablet; Take  1 tablet (500 mg total) by mouth daily with breakfast.  Dispense: 30 tablet; Refill: 0  3. At increased risk of exposure to COVID-19 virus Brandi Wise was given approximately 15 minutes of COVID prevention counseling today Counseling COVID-19 is a respiratory infection that is caused by a virus. It can cause serious infections, such as pneumonia, acute respiratory distress syndrome, acute respiratory failure, or sepsis. You are more likely to develop a serious illness if you are 32 years of age or older, have a weak immune system, live in a nursing home, have chronic disease, or have obesity. Get vaccinated as soon as they are available to you.  For our most current information, please visit [email or web address]. Wash your hands often with soap and water for 20 seconds. If soap and water are not available, use alcohol-based hand sanitizer. Wear a face mask. Make sure your mask covers your nose and mouth. Maintain at least 6 feet distance from others when in public.  Get help right away if You have trouble breathing, chest pain, confusion, or other concerning symptoms.  Repetitive spaced learning was employed today to elicit superior memory formation and behavioral change.  4. Obesity with current BMI of 35.9  Brandi Wise is currently in the action stage of change. As such, her goal is to continue with weight loss efforts. She has agreed to the Category 4 Plan.   Exercise goals:  As is  Behavioral modification strategies: increasing lean protein intake, meal planning and cooking strategies, keeping healthy foods in the home, and  planning for success.  Brandi Wise has agreed to follow-up with our clinic in 3 weeks. She was informed of the importance of frequent follow-up visits to maximize her success with intensive lifestyle modifications for her multiple health conditions.   Objective:   Blood pressure 112/72, pulse 68, temperature (!) 97.4 F (36.3 C), height '5\' 5"'$  (1.651 m), weight 215 lb  (97.5 kg), last menstrual period 07/28/2021, SpO2 97 %, unknown if currently breastfeeding. Body mass index is 35.78 kg/m.  General: Cooperative, alert, well developed, in no acute distress. HEENT: Conjunctivae and lids unremarkable. Cardiovascular: Regular rhythm.  Lungs: Normal work of breathing. Neurologic: No focal deficits.   Lab Results  Component Value Date   CREATININE 0.81 06/17/2021   BUN 12 06/17/2021   NA 136 06/17/2021   K 4.7 06/17/2021   CL 101 06/17/2021   CO2 22 06/17/2021   Lab Results  Component Value Date   ALT 12 06/17/2021   AST 26 06/17/2021   ALKPHOS 64 06/17/2021   BILITOT 0.3 06/17/2021   Lab Results  Component Value Date   HGBA1C 5.8 (H) 06/17/2021   HGBA1C 5.7 (H) 12/25/2020   Lab Results  Component Value Date   INSULIN 8.5 06/17/2021   INSULIN 34.2 (H) 12/25/2020   Lab Results  Component Value Date   TSH 0.567 12/25/2020   Lab Results  Component Value Date   CHOL 160 12/25/2020   HDL 54 12/25/2020   LDLCALC 97 12/25/2020   TRIG 39 12/25/2020   Lab Results  Component Value Date   VD25OH 31.6 06/17/2021   VD25OH 17.9 (L) 12/25/2020   Lab Results  Component Value Date   WBC 9.6 05/12/2021   HGB 12.5 05/12/2021   HCT 38.5 05/12/2021   MCV 90.4 05/12/2021   PLT 415 (H) 05/12/2021    Attestation Statements:   Reviewed by clinician on day of visit: allergies, medications, problem list, medical history, surgical history, family history, social history, and previous encounter notes.  Coral Ceo, CMA, am acting as transcriptionist for Coralie Common, MD.   I have reviewed the above documentation for accuracy and completeness, and I agree with the above. - Coralie Common, MD

## 2021-08-12 IMAGING — US US THYROID
1 series · 13 of 25 positions shown · non-contrast
Comparison: None.

CLINICAL DATA: Thyroid mass on exam

EXAM:
THYROID ULTRASOUND
TECHNIQUE: Ultrasound examination of the thyroid gland and adjacent soft
tissues was performed.

[Series 1: us thyroid · 0.05mm/px · 13 of 44 slices shown]
[im 1/44]
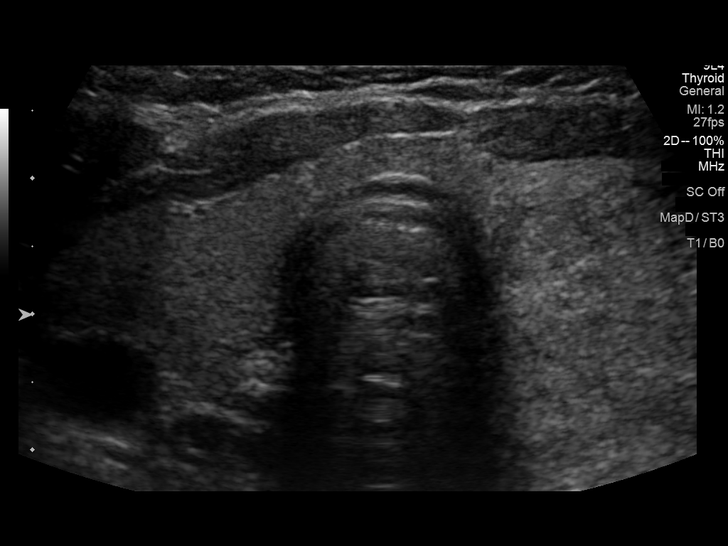
[im 4/44]
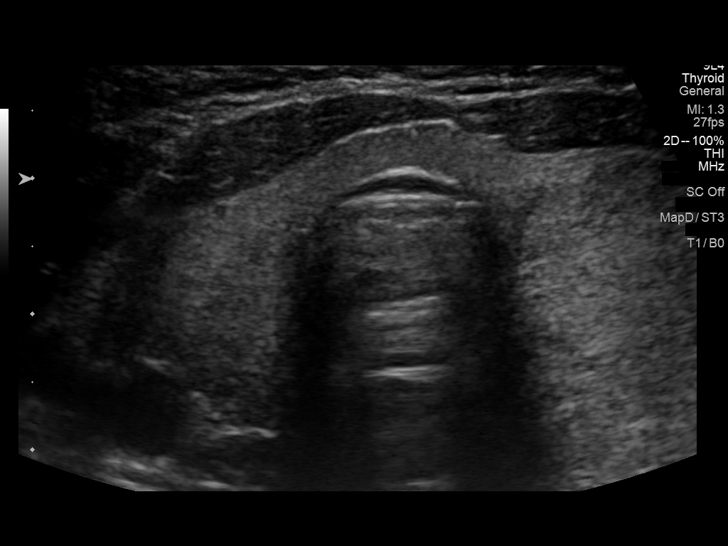
[im 8/44]
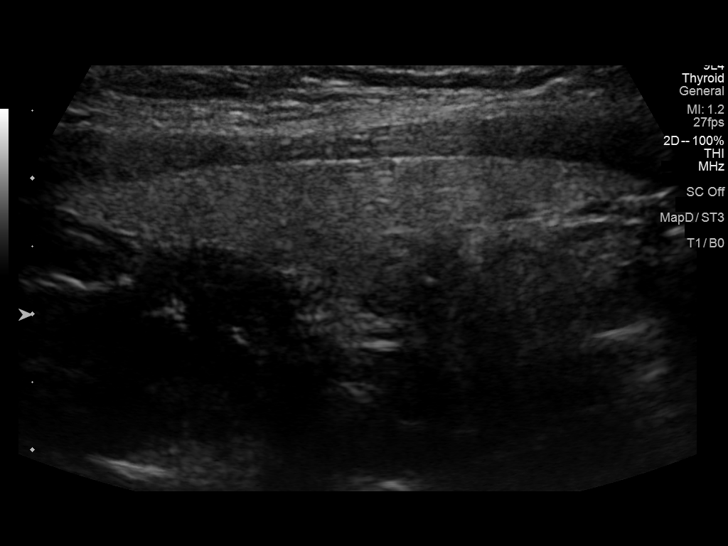
[im 11/44]
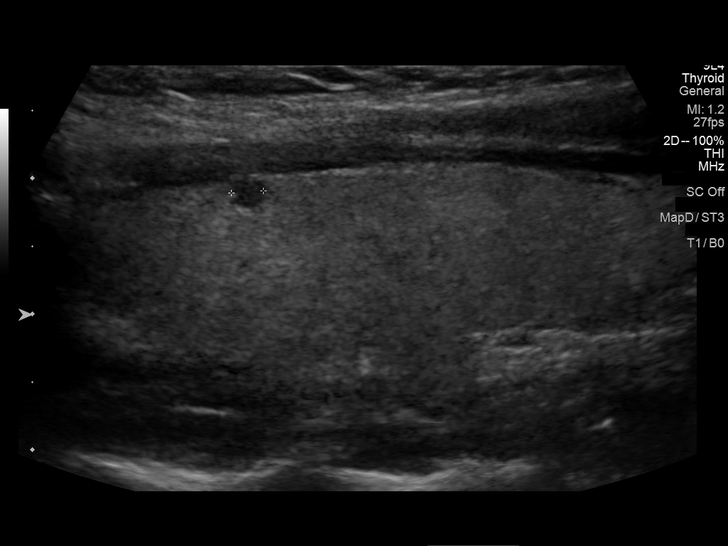
[im 15/44]
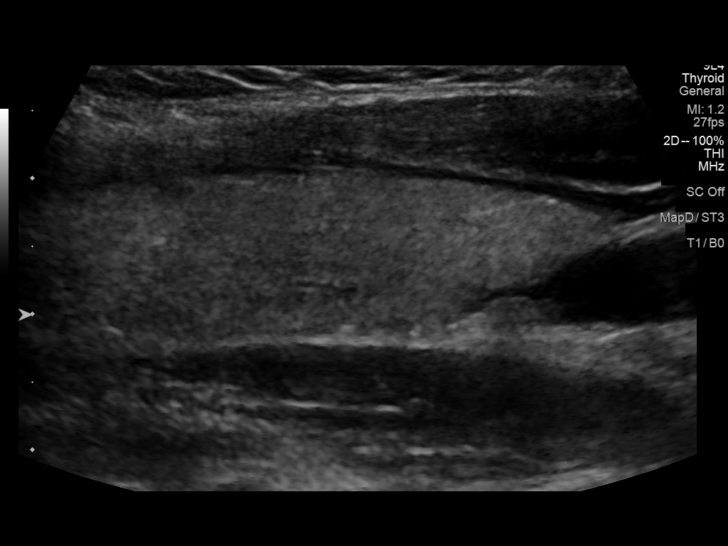
[im 18/44]
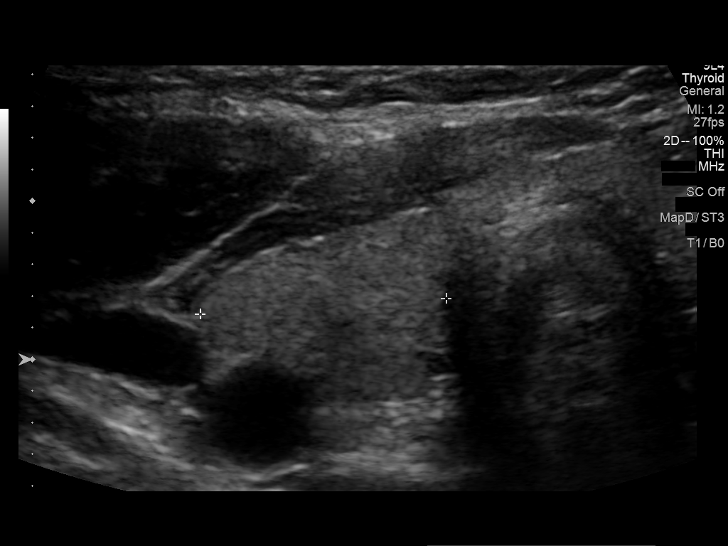
[im 22/44]
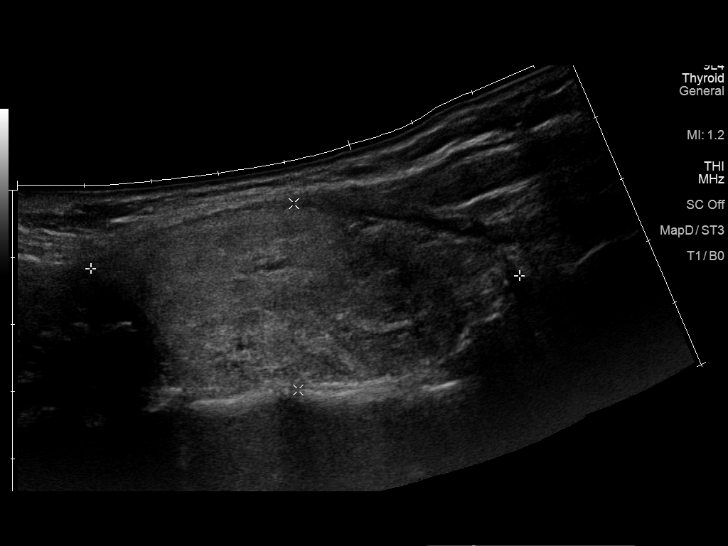
[im 26/44]
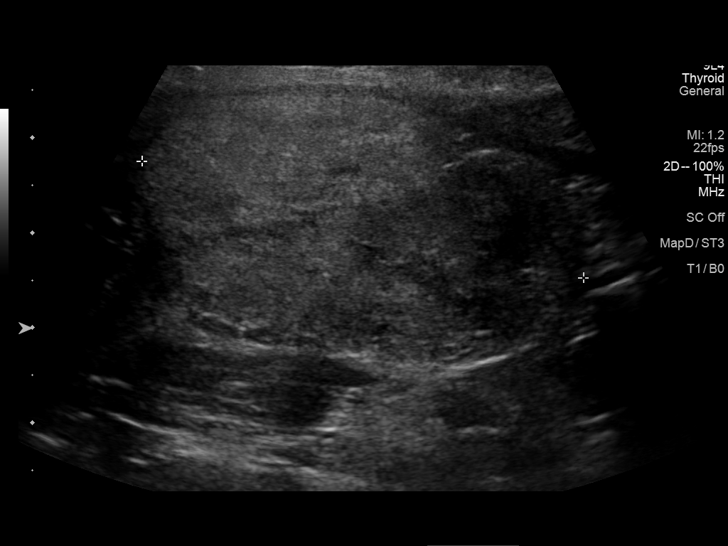
[im 29/44]
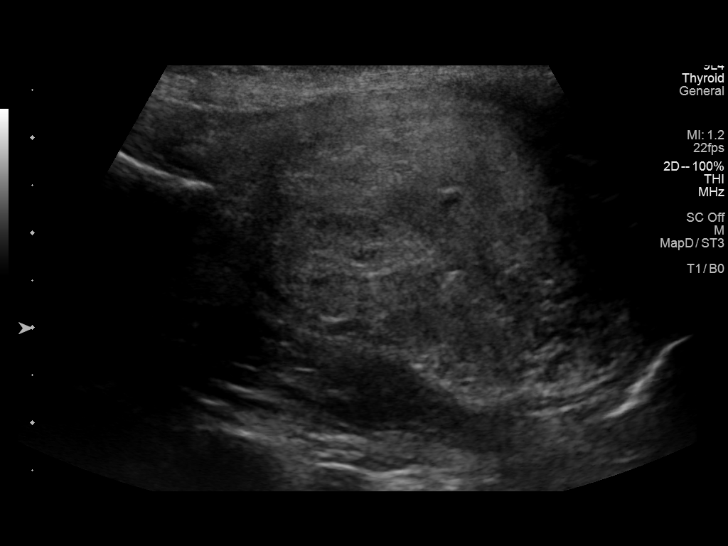
[im 33/44]
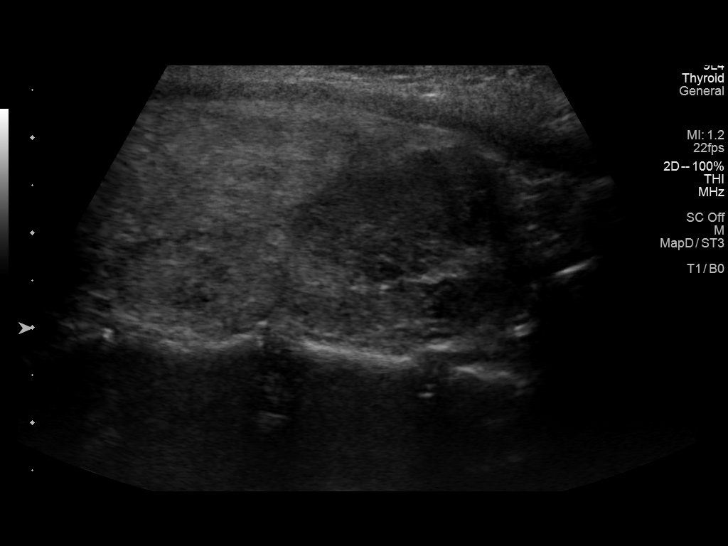
[im 36/44]
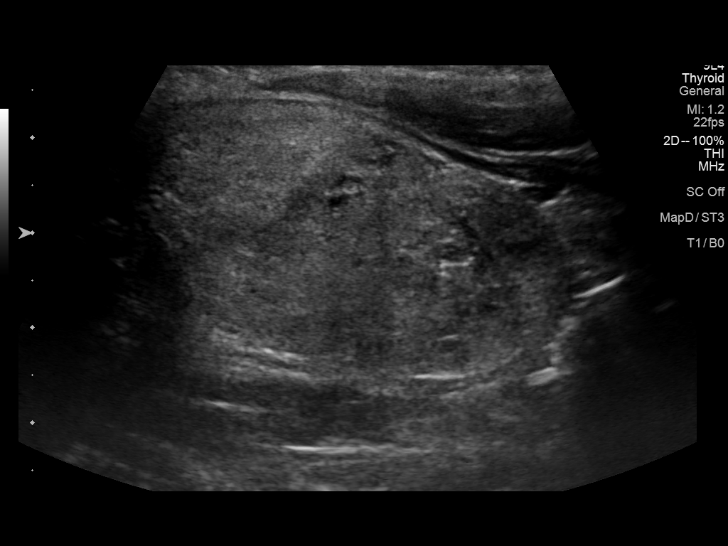
[im 40/44]
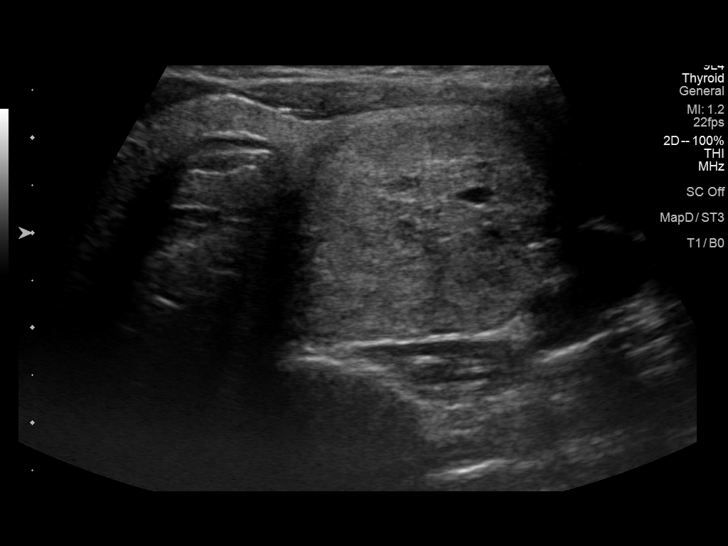
[im 44/44]
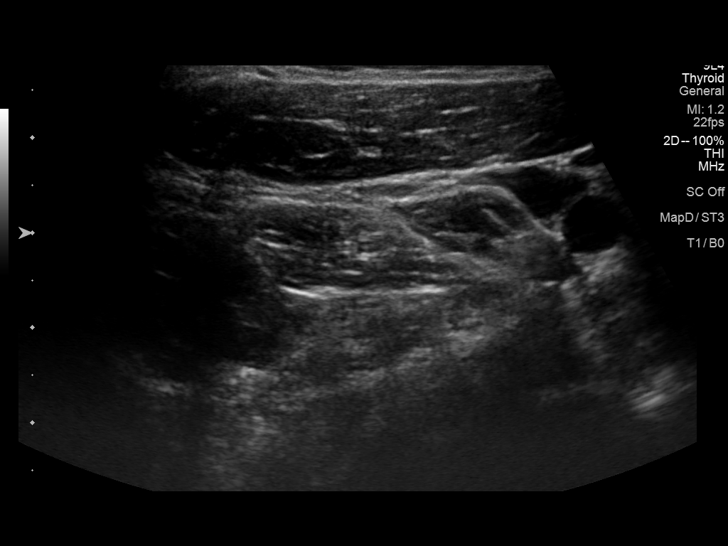

[13 of 25 positions shown; findings below may reference images not displayed]

FINDINGS: Parenchymal Echotexture: Mildly heterogenous

Isthmus: 4 mm

Right lobe: 4.9 x 1.6 x 1.6 cm

Left lobe: 6.4 x 2.8 x 2.6 cm

_________________________________________________________

Estimated total number of nodules >/= 1 cm: 1

Number of spongiform nodules >/=  2 cm not described below (TR1): 0

Number of mixed cystic and solid nodules >/= 1.5 cm not described
below (TR2): 0

_________________________________________________________

Nodule # 1:

Location: Left; Mid

Maximum size: 4.8 cm; Other 2 dimensions: 3.1 x 2.7 cm

Composition: solid/almost completely solid (2)

Echogenicity: isoechoic (1)

Shape: not taller-than-wide (0)

Margins: ill-defined (0)

Echogenic foci: none (0)

ACR TI-RADS total points: 3.

ACR TI-RADS risk category: TR3 (3 points).

ACR TI-RADS recommendations:

**Given size (>/= 2.5 cm) and appearance, fine needle aspiration of
this mildly suspicious nodule should be considered based on TI-RADS
criteria.

_________________________________________________________
IMPRESSION: 4.8 cm left mid thyroid TR 3 nodule meets criteria for biopsy as
above.

The above is in keeping with the ACR TI-RADS recommendations - [HOSPITAL] 2481;[DATE].

## 2021-08-16 DIAGNOSIS — L910 Hypertrophic scar: Secondary | ICD-10-CM | POA: Diagnosis not present

## 2021-08-31 ENCOUNTER — Encounter (INDEPENDENT_AMBULATORY_CARE_PROVIDER_SITE_OTHER): Payer: Self-pay | Admitting: Family Medicine

## 2021-08-31 ENCOUNTER — Ambulatory Visit (INDEPENDENT_AMBULATORY_CARE_PROVIDER_SITE_OTHER): Payer: 59 | Admitting: Family Medicine

## 2021-08-31 ENCOUNTER — Other Ambulatory Visit: Payer: Self-pay

## 2021-08-31 VITALS — BP 121/82 | HR 75 | Temp 97.9°F | Ht 65.0 in | Wt 217.0 lb

## 2021-08-31 DIAGNOSIS — E559 Vitamin D deficiency, unspecified: Secondary | ICD-10-CM | POA: Diagnosis not present

## 2021-08-31 DIAGNOSIS — Z6837 Body mass index (BMI) 37.0-37.9, adult: Secondary | ICD-10-CM

## 2021-08-31 DIAGNOSIS — R7303 Prediabetes: Secondary | ICD-10-CM

## 2021-09-01 DIAGNOSIS — N96 Recurrent pregnancy loss: Secondary | ICD-10-CM | POA: Diagnosis not present

## 2021-09-01 NOTE — Progress Notes (Signed)
Chief Complaint:   OBESITY Brandi Wise is here to discuss her progress with her obesity treatment plan along with follow-up of her obesity related diagnoses. Brandi Wise is on the Category 4 Plan and states she is following her eating plan approximately 50% of the time. Brandi Wise states she is doing 10,000 steps 7 times per week.  Today's visit was #: 12 Starting weight: 223 lbs Starting date: 12/25/2020 Today's weight: 217 lbs Today's date: 08/31/2021 Total lbs lost to date: 6 Total lbs lost since last in-office visit: 0  Interim History: Brandi Wise returned from [place] and voices travel was long but no delays. She just returned 3 days ago. She is planning on getting back to plan and gym in the next few weeks. Pt has no foreseeable obstacles in the next few weeks.  Subjective:   1. Pre-diabetes Brandi Wise's last A1c was 5.8 and insulin level 8.5. She is on Metformin.  2. Vitamin D deficiency Pt is on prescription Vit D and reports fatigue.  Assessment/Plan:   1. Pre-diabetes Brandi Wise will continue to work on weight loss, exercise, and decreasing simple carbohydrates to help decrease the risk of diabetes. Continue Metformin. No refill needed at this time.  2. Vitamin D deficiency Low Vitamin D level contributes to fatigue and are associated with obesity, breast, and colon cancer. She agrees to continue to take prescription Vitamin D 50,000 IU every week and will follow-up for routine testing of Vitamin D, at least 2-3 times per year to avoid over-replacement.  3. Obesity with current BMI of 36.2  Brandi Wise is currently in the action stage of change. As such, her goal is to continue with weight loss efforts. She has agreed to the Category 4 Plan.   Exercise goals:  As is  Behavioral modification strategies: increasing lean protein intake, meal planning and cooking strategies, keeping healthy foods in the home, and planning for success.  Brandi Wise has agreed to follow-up with our clinic in 3-4  weeks. She was informed of the importance of frequent follow-up visits to maximize her success with intensive lifestyle modifications for her multiple health conditions.   Objective:   Blood pressure 121/82, pulse 75, temperature 97.9 F (36.6 C), height 5\' 5"  (1.651 m), weight 217 lb (98.4 kg), SpO2 99 %, unknown if currently breastfeeding. Body mass index is 36.11 kg/m.  General: Cooperative, alert, well developed, in no acute distress. HEENT: Conjunctivae and lids unremarkable. Cardiovascular: Regular rhythm.  Lungs: Normal work of breathing. Neurologic: No focal deficits.   Lab Results  Component Value Date   CREATININE 0.81 06/17/2021   BUN 12 06/17/2021   NA 136 06/17/2021   K 4.7 06/17/2021   CL 101 06/17/2021   CO2 22 06/17/2021   Lab Results  Component Value Date   ALT 12 06/17/2021   AST 26 06/17/2021   ALKPHOS 64 06/17/2021   BILITOT 0.3 06/17/2021   Lab Results  Component Value Date   HGBA1C 5.8 (H) 06/17/2021   HGBA1C 5.7 (H) 12/25/2020   Lab Results  Component Value Date   INSULIN 8.5 06/17/2021   INSULIN 34.2 (H) 12/25/2020   Lab Results  Component Value Date   TSH 0.567 12/25/2020   Lab Results  Component Value Date   CHOL 160 12/25/2020   HDL 54 12/25/2020   LDLCALC 97 12/25/2020   TRIG 39 12/25/2020   Lab Results  Component Value Date   VD25OH 31.6 06/17/2021   VD25OH 17.9 (L) 12/25/2020   Lab Results  Component Value Date  WBC 9.6 05/12/2021   HGB 12.5 05/12/2021   HCT 38.5 05/12/2021   MCV 90.4 05/12/2021   PLT 415 (H) 05/12/2021    Attestation Statements:   Reviewed by clinician on day of visit: allergies, medications, problem list, medical history, surgical history, family history, social history, and previous encounter notes.  Time spent on visit including pre-visit chart review and post-visit care and charting was 12 minutes.   Coral Ceo, CMA, am acting as transcriptionist for Coralie Common, MD.   I have  reviewed the above documentation for accuracy and completeness, and I agree with the above. - Coralie Common, MD

## 2021-09-20 ENCOUNTER — Ambulatory Visit (INDEPENDENT_AMBULATORY_CARE_PROVIDER_SITE_OTHER): Payer: 59 | Admitting: Family Medicine

## 2021-09-20 DIAGNOSIS — N96 Recurrent pregnancy loss: Secondary | ICD-10-CM | POA: Diagnosis not present

## 2021-09-27 DIAGNOSIS — E288 Other ovarian dysfunction: Secondary | ICD-10-CM | POA: Diagnosis not present

## 2021-09-27 DIAGNOSIS — Z3189 Encounter for other procreative management: Secondary | ICD-10-CM | POA: Diagnosis not present

## 2021-09-29 DIAGNOSIS — Z3189 Encounter for other procreative management: Secondary | ICD-10-CM | POA: Diagnosis not present

## 2021-10-15 ENCOUNTER — Encounter: Payer: Self-pay | Admitting: Internal Medicine

## 2021-10-15 ENCOUNTER — Ambulatory Visit: Payer: 59 | Admitting: Internal Medicine

## 2021-10-15 ENCOUNTER — Other Ambulatory Visit: Payer: Self-pay

## 2021-10-15 VITALS — BP 118/70 | HR 66 | Resp 18 | Ht 65.0 in | Wt 220.6 lb

## 2021-10-15 DIAGNOSIS — Z0000 Encounter for general adult medical examination without abnormal findings: Secondary | ICD-10-CM | POA: Diagnosis not present

## 2021-10-15 DIAGNOSIS — E890 Postprocedural hypothyroidism: Secondary | ICD-10-CM

## 2021-10-15 DIAGNOSIS — E041 Nontoxic single thyroid nodule: Secondary | ICD-10-CM

## 2021-10-15 DIAGNOSIS — R7303 Prediabetes: Secondary | ICD-10-CM | POA: Diagnosis not present

## 2021-10-15 LAB — T4, FREE: Free T4: 0.68 ng/dL (ref 0.60–1.60)

## 2021-10-15 LAB — TSH: TSH: 2.58 u[IU]/mL (ref 0.35–5.50)

## 2021-10-15 NOTE — Progress Notes (Signed)
   Subjective:   Patient ID: Brandi Wise, female    DOB: August 15, 1991, 30 y.o.   MRN: [id number]  HPI The patient is a 30 YO female coming in for follow up thyroid surgery and medical conditions.   Also wants physical.   PMH, [facility], social history reviewed and updated.  Review of Systems  Constitutional: Negative.   HENT: Negative.    Eyes: Negative.   Respiratory:  Negative for cough, chest tightness and shortness of breath.   Cardiovascular:  Negative for chest pain, palpitations and leg swelling.  Gastrointestinal:  Negative for abdominal distention, abdominal pain, constipation, diarrhea, nausea and vomiting.  Musculoskeletal: Negative.   Skin: Negative.   Neurological: Negative.   Psychiatric/Behavioral: Negative.     Objective:  Physical Exam Constitutional:      Appearance: She is well-developed.  HENT:     Head: Normocephalic and atraumatic.  Cardiovascular:     Rate and Rhythm: Normal rate and regular rhythm.  Pulmonary:     Effort: Pulmonary effort is normal. No respiratory distress.     Breath sounds: Normal breath sounds. No wheezing or rales.  Abdominal:     General: Bowel sounds are normal. There is no distension.     Palpations: Abdomen is soft.     Tenderness: There is no abdominal tenderness. There is no rebound.  Musculoskeletal:     Cervical back: Normal range of motion.  Skin:    General: Skin is warm and dry.  Neurological:     Mental Status: She is alert and oriented to person, place, and time.     Coordination: Coordination normal.    Vitals:   10/15/21 0818  BP: 118/70  Pulse: 66  Resp: 18  SpO2: 99%  Weight: 220 lb 9.6 oz (100.1 kg)  Height: 5\' 5"  (1.651 m)    This visit occurred during the SARS-CoV-2 public health emergency.  Safety protocols were in place, including screening questions prior to the visit, additional usage of staff PPE, and extensive cleaning of exam room while observing appropriate contact time as indicated for  disinfecting solutions.   Assessment & Plan:

## 2021-10-15 NOTE — Assessment & Plan Note (Signed)
Most recent HgA1c 5.8 and should be monitored every 6-12 months. Counseled on diet today. Not on medication.

## 2021-10-15 NOTE — Assessment & Plan Note (Signed)
Surgery for nodule (not cancerous) June 2022 and no recheck of levels. No symptoms of underactive thyroid. Checking TSH and free T4 and would monitor every 1-2 years or with new symptoms. Discussed symptoms to monitor for during visit today.

## 2021-10-15 NOTE — Patient Instructions (Signed)
We will check the thyroid levels today to make sure they are normal.

## 2021-10-15 NOTE — Assessment & Plan Note (Signed)
Flu shot up to date. Covid-19 booster counseled. Tetanus unknown if up to date trying for pregnancy so will likely get during pregnancy. Pap smear up to date. Counseled about sun safety and mole surveillance. Counseled about the dangers of distracted driving. Given 10 year screening recommendations.

## 2021-10-19 ENCOUNTER — Telehealth: Payer: Self-pay | Admitting: Internal Medicine

## 2021-10-19 DIAGNOSIS — E039 Hypothyroidism, unspecified: Secondary | ICD-10-CM

## 2021-10-19 NOTE — Telephone Encounter (Signed)
See below

## 2021-10-19 NOTE — Telephone Encounter (Signed)
Patient calling in  Says she reviewed results on mychart & would like to start rhe low dose thyroid medication  Pharmacy  [id number] - [place], [place]  Phone:  [phone] Fax:  [phone]

## 2021-10-20 MED ORDER — LEVOTHYROXINE SODIUM 50 MCG PO TABS
50.0000 ug | ORAL_TABLET | Freq: Every day | ORAL | 3 refills | Status: DC
  Filled 2021-11-23: qty 90, 90d supply, fill #0
  Filled 2022-02-08: qty 90, 90d supply, fill #1
  Filled 2022-04-20: qty 90, 90d supply, fill #2

## 2021-10-20 NOTE — Telephone Encounter (Signed)
Sent in and labs ordered for 6-8 weeks after starting.

## 2021-10-25 DIAGNOSIS — Z3189 Encounter for other procreative management: Secondary | ICD-10-CM | POA: Diagnosis not present

## 2021-10-26 ENCOUNTER — Other Ambulatory Visit (HOSPITAL_COMMUNITY): Payer: Self-pay

## 2021-10-26 MED ORDER — LETROZOLE 2.5 MG PO TABS
7.5000 mg | ORAL_TABLET | Freq: Every day | ORAL | 3 refills | Status: DC
  Filled 2021-11-09: qty 15, 5d supply, fill #0

## 2021-10-27 DIAGNOSIS — Z3189 Encounter for other procreative management: Secondary | ICD-10-CM | POA: Diagnosis not present

## 2021-11-09 ENCOUNTER — Other Ambulatory Visit (HOSPITAL_COMMUNITY): Payer: Self-pay

## 2021-11-19 DIAGNOSIS — Z3189 Encounter for other procreative management: Secondary | ICD-10-CM | POA: Diagnosis not present

## 2021-11-22 DIAGNOSIS — Z3189 Encounter for other procreative management: Secondary | ICD-10-CM | POA: Diagnosis not present

## 2021-11-23 ENCOUNTER — Other Ambulatory Visit (HOSPITAL_COMMUNITY): Payer: Self-pay

## 2021-11-28 NOTE — L&D Delivery Note (Signed)
Operative Delivery Note At 2:55 AM a viable and healthy female was delivered via Vaginal, Vacuum Neurosurgeon).  Presentation: vertex; Position: Left,, Occiput,, Anterior; Station: +3.  Verbal consent: obtained from patient.  Risks and benefits discussed in detail.  Risks include, but are not limited to the risks of anesthesia, bleeding, infection, damage to maternal tissues, fetal cephalhematoma.  There is also the risk of inability to effect vaginal delivery of the head, or shoulder dystocia that cannot be resolved by established maneuvers, leading to the need for emergency cesarean section. Indication: fetal  terminal bradycardia ( FHR 90"s) APGAR: 7, 8; weight 7 lb 14.3 oz (3580 g).   Placenta status: spontaneous intact not sent , .   Cord:  with the following complications: .none  Cord pH: n/a  Anesthesia:  epidural Instruments: mushroom Episiotomy: None Lacerations: Perineal;4th degree; right Sulcus  Suture Repair: 3.0 chromic vicryl Rectal exam. Rectal mucosa <1 cm ( superficial), sphincter partial tear on right wall, sphincter repaired with 0 Vicryl figure of eight sutures x 3. Rectal mucosa : 4 0 vicryl suture, remaining repair with 3-0 chromic sutures,.   Bladder cath for 300 ml urine ( clear). Rectal  Est. Blood Loss (mL): 848  Mom to postpartum.  Baby to Couplet care / Skin to Skin.  Brandi Wise 08/26/2022, 3:53 AM

## 2021-12-07 ENCOUNTER — Other Ambulatory Visit (HOSPITAL_COMMUNITY): Payer: Self-pay

## 2021-12-07 DIAGNOSIS — Z319 Encounter for procreative management, unspecified: Secondary | ICD-10-CM | POA: Diagnosis not present

## 2021-12-07 DIAGNOSIS — Z3189 Encounter for other procreative management: Secondary | ICD-10-CM | POA: Diagnosis not present

## 2021-12-07 MED ORDER — CHORIOGONADOTROPIN ALFA 250 MCG/0.5ML ~~LOC~~ INJ
INJECTION | SUBCUTANEOUS | 2 refills | Status: DC
  Filled 2021-12-07: qty 0.5, 1d supply, fill #0

## 2021-12-07 MED ORDER — LETROZOLE 2.5 MG PO TABS
7.5000 mg | ORAL_TABLET | ORAL | 2 refills | Status: DC
  Filled 2021-12-07: qty 15, 5d supply, fill #0

## 2021-12-07 MED ORDER — PROGESTERONE 200 MG PO CAPS
200.0000 mg | ORAL_CAPSULE | Freq: Two times a day (BID) | ORAL | 2 refills | Status: DC
  Filled 2021-12-07: qty 60, 30d supply, fill #0
  Filled 2022-01-14: qty 60, 30d supply, fill #1

## 2021-12-07 MED ORDER — GONAL-F RFF REDIJECT 450 UNT/0.75ML ~~LOC~~ SOPN
75.0000 [IU] | PEN_INJECTOR | Freq: Every day | SUBCUTANEOUS | 2 refills | Status: DC
  Filled 2021-12-07: qty 0.75, 1d supply, fill #0

## 2021-12-08 ENCOUNTER — Other Ambulatory Visit (HOSPITAL_COMMUNITY): Payer: Self-pay

## 2021-12-14 DIAGNOSIS — Z3189 Encounter for other procreative management: Secondary | ICD-10-CM | POA: Diagnosis not present

## 2021-12-14 DIAGNOSIS — Z319 Encounter for procreative management, unspecified: Secondary | ICD-10-CM | POA: Diagnosis not present

## 2021-12-17 DIAGNOSIS — Z3189 Encounter for other procreative management: Secondary | ICD-10-CM | POA: Diagnosis not present

## 2021-12-31 DIAGNOSIS — Z3200 Encounter for pregnancy test, result unknown: Secondary | ICD-10-CM | POA: Diagnosis not present

## 2022-01-03 DIAGNOSIS — Z3200 Encounter for pregnancy test, result unknown: Secondary | ICD-10-CM | POA: Diagnosis not present

## 2022-01-03 DIAGNOSIS — Z3201 Encounter for pregnancy test, result positive: Secondary | ICD-10-CM | POA: Diagnosis not present

## 2022-01-14 ENCOUNTER — Other Ambulatory Visit (HOSPITAL_COMMUNITY): Payer: Self-pay

## 2022-01-14 DIAGNOSIS — Z3200 Encounter for pregnancy test, result unknown: Secondary | ICD-10-CM | POA: Diagnosis not present

## 2022-01-14 MED ORDER — FOLIC ACID 1 MG PO TABS
1.0000 mg | ORAL_TABLET | Freq: Every day | ORAL | 2 refills | Status: AC
  Filled 2022-01-14: qty 90, 90d supply, fill #0
  Filled 2022-04-14: qty 90, 90d supply, fill #1
  Filled 2022-07-15: qty 90, 90d supply, fill #2

## 2022-01-28 DIAGNOSIS — O0900 Supervision of pregnancy with history of infertility, unspecified trimester: Secondary | ICD-10-CM | POA: Diagnosis not present

## 2022-02-08 ENCOUNTER — Other Ambulatory Visit (HOSPITAL_COMMUNITY): Payer: Self-pay

## 2022-02-16 DIAGNOSIS — O09299 Supervision of pregnancy with other poor reproductive or obstetric history, unspecified trimester: Secondary | ICD-10-CM | POA: Diagnosis not present

## 2022-02-16 DIAGNOSIS — Z3481 Encounter for supervision of other normal pregnancy, first trimester: Secondary | ICD-10-CM | POA: Diagnosis not present

## 2022-02-23 DIAGNOSIS — O2621 Pregnancy care for patient with recurrent pregnancy loss, first trimester: Secondary | ICD-10-CM | POA: Diagnosis not present

## 2022-02-23 DIAGNOSIS — O99211 Obesity complicating pregnancy, first trimester: Secondary | ICD-10-CM | POA: Diagnosis not present

## 2022-02-23 DIAGNOSIS — O0991 Supervision of high risk pregnancy, unspecified, first trimester: Secondary | ICD-10-CM | POA: Diagnosis not present

## 2022-02-23 DIAGNOSIS — Z1589 Genetic susceptibility to other disease: Secondary | ICD-10-CM | POA: Diagnosis not present

## 2022-02-23 DIAGNOSIS — O30041 Twin pregnancy, dichorionic/diamniotic, first trimester: Secondary | ICD-10-CM | POA: Diagnosis not present

## 2022-02-23 DIAGNOSIS — E039 Hypothyroidism, unspecified: Secondary | ICD-10-CM | POA: Diagnosis not present

## 2022-02-23 DIAGNOSIS — O99281 Endocrine, nutritional and metabolic diseases complicating pregnancy, first trimester: Secondary | ICD-10-CM | POA: Diagnosis not present

## 2022-02-23 DIAGNOSIS — O3120X0 Continuing pregnancy after intrauterine death of one fetus or more, unspecified trimester, not applicable or unspecified: Secondary | ICD-10-CM | POA: Diagnosis not present

## 2022-02-23 DIAGNOSIS — Z3481 Encounter for supervision of other normal pregnancy, first trimester: Secondary | ICD-10-CM | POA: Diagnosis not present

## 2022-02-23 LAB — OB RESULTS CONSOLE GC/CHLAMYDIA
Chlamydia: NEGATIVE
Neisseria Gonorrhea: NEGATIVE

## 2022-02-23 LAB — HEPATITIS C ANTIBODY: HCV Ab: NEGATIVE

## 2022-02-23 LAB — OB RESULTS CONSOLE RPR: RPR: NONREACTIVE

## 2022-02-23 LAB — OB RESULTS CONSOLE HIV ANTIBODY (ROUTINE TESTING): HIV: NONREACTIVE

## 2022-02-23 LAB — OB RESULTS CONSOLE RUBELLA ANTIBODY, IGM: Rubella: IMMUNE

## 2022-02-23 LAB — OB RESULTS CONSOLE HEPATITIS B SURFACE ANTIGEN: Hepatitis B Surface Ag: NEGATIVE

## 2022-03-22 DIAGNOSIS — Z361 Encounter for antenatal screening for raised alphafetoprotein level: Secondary | ICD-10-CM | POA: Diagnosis not present

## 2022-03-22 DIAGNOSIS — R7303 Prediabetes: Secondary | ICD-10-CM | POA: Diagnosis not present

## 2022-03-22 DIAGNOSIS — Z3482 Encounter for supervision of other normal pregnancy, second trimester: Secondary | ICD-10-CM | POA: Diagnosis not present

## 2022-03-30 DIAGNOSIS — M7989 Other specified soft tissue disorders: Secondary | ICD-10-CM | POA: Diagnosis not present

## 2022-03-30 DIAGNOSIS — M79661 Pain in right lower leg: Secondary | ICD-10-CM | POA: Diagnosis not present

## 2022-04-05 DIAGNOSIS — O99282 Endocrine, nutritional and metabolic diseases complicating pregnancy, second trimester: Secondary | ICD-10-CM | POA: Diagnosis not present

## 2022-04-05 DIAGNOSIS — O3120X0 Continuing pregnancy after intrauterine death of one fetus or more, unspecified trimester, not applicable or unspecified: Secondary | ICD-10-CM | POA: Diagnosis not present

## 2022-04-05 DIAGNOSIS — Z3689 Encounter for other specified antenatal screening: Secondary | ICD-10-CM | POA: Diagnosis not present

## 2022-04-05 DIAGNOSIS — Z3482 Encounter for supervision of other normal pregnancy, second trimester: Secondary | ICD-10-CM | POA: Diagnosis not present

## 2022-04-05 DIAGNOSIS — O3110X0 Continuing pregnancy after spontaneous abortion of one fetus or more, unspecified trimester, not applicable or unspecified: Secondary | ICD-10-CM | POA: Diagnosis not present

## 2022-04-05 DIAGNOSIS — O2622 Pregnancy care for patient with recurrent pregnancy loss, second trimester: Secondary | ICD-10-CM | POA: Diagnosis not present

## 2022-04-05 DIAGNOSIS — O99212 Obesity complicating pregnancy, second trimester: Secondary | ICD-10-CM | POA: Diagnosis not present

## 2022-04-14 ENCOUNTER — Other Ambulatory Visit (HOSPITAL_COMMUNITY): Payer: Self-pay

## 2022-04-20 ENCOUNTER — Other Ambulatory Visit (HOSPITAL_COMMUNITY): Payer: Self-pay

## 2022-04-22 ENCOUNTER — Other Ambulatory Visit (HOSPITAL_COMMUNITY): Payer: Self-pay

## 2022-04-26 ENCOUNTER — Telehealth: Payer: Self-pay | Admitting: Internal Medicine

## 2022-04-26 ENCOUNTER — Encounter: Payer: Self-pay | Admitting: Internal Medicine

## 2022-04-26 ENCOUNTER — Other Ambulatory Visit: Payer: Self-pay | Admitting: Internal Medicine

## 2022-04-26 ENCOUNTER — Other Ambulatory Visit (HOSPITAL_COMMUNITY): Payer: Self-pay

## 2022-04-26 MED ORDER — LEVOTHYROXINE SODIUM 50 MCG PO TABS
50.0000 ug | ORAL_TABLET | Freq: Every day | ORAL | 0 refills | Status: DC
  Filled 2022-04-26: qty 20, 20d supply, fill #0

## 2022-04-26 NOTE — Telephone Encounter (Signed)
Did not return for labs. Is she pregnant? (I know she was trying to get pregnant at last visit). If pregnant needs visit and will need labs more frequently while pregnant and this is very important.

## 2022-04-26 NOTE — Telephone Encounter (Signed)
Patient called back and states that medication went to wrong pharmacy. [facility]. Please resend thyroid medication to the [facility].

## 2022-04-26 NOTE — Telephone Encounter (Signed)
Pt states she is pregnant and states she is out of levothyroxine (SYNTHROID) 50 MCG tablet. Please sens medication to:   [facility] Phone:  [phone]  Fax:  [phone]

## 2022-04-26 NOTE — Telephone Encounter (Signed)
Responded via mychart message

## 2022-04-26 NOTE — Telephone Encounter (Signed)
Medication was sent to [facility]. Confirmation that they received the rx request was at 10:50 am

## 2022-04-27 ENCOUNTER — Other Ambulatory Visit (HOSPITAL_COMMUNITY): Payer: Self-pay

## 2022-04-27 ENCOUNTER — Ambulatory Visit: Payer: 59 | Admitting: Internal Medicine

## 2022-04-27 ENCOUNTER — Encounter: Payer: Self-pay | Admitting: Internal Medicine

## 2022-04-27 DIAGNOSIS — E039 Hypothyroidism, unspecified: Secondary | ICD-10-CM

## 2022-04-27 DIAGNOSIS — E890 Postprocedural hypothyroidism: Secondary | ICD-10-CM | POA: Diagnosis not present

## 2022-04-27 LAB — T4, FREE: Free T4: 0.55 ng/dL — ABNORMAL LOW (ref 0.60–1.60)

## 2022-04-27 LAB — TSH: TSH: 1.45 u[IU]/mL (ref 0.35–5.50)

## 2022-04-27 MED ORDER — LEVOTHYROXINE SODIUM 75 MCG PO TABS
75.0000 ug | ORAL_TABLET | Freq: Every day | ORAL | 3 refills | Status: DC
  Filled 2022-04-27: qty 30, 30d supply, fill #0
  Filled 2022-05-26: qty 30, 30d supply, fill #1
  Filled 2022-07-01: qty 30, 30d supply, fill #2
  Filled 2022-08-08: qty 30, 30d supply, fill #3

## 2022-04-27 NOTE — Progress Notes (Signed)
   Subjective:   Patient ID: Brandi Wise, female    DOB: [birth year], 32 y.o.   MRN: [id number]  HPI The patient is a 31 YO female coming in for follow up thyroid pregnant [redacted] weeks.   Review of Systems  Constitutional: Negative.   HENT: Negative.    Eyes: Negative.   Respiratory:  Negative for cough, chest tightness and shortness of breath.   Cardiovascular:  Negative for chest pain, palpitations and leg swelling.  Gastrointestinal:  Negative for abdominal distention, abdominal pain, constipation, diarrhea, nausea and vomiting.  Musculoskeletal: Negative.   Skin: Negative.   Neurological: Negative.   Psychiatric/Behavioral: Negative.     Objective:  Physical Exam Constitutional:      Appearance: She is well-developed.  HENT:     Head: Normocephalic and atraumatic.  Cardiovascular:     Rate and Rhythm: Normal rate and regular rhythm.  Pulmonary:     Effort: Pulmonary effort is normal. No respiratory distress.     Breath sounds: Normal breath sounds. No wheezing or rales.  Abdominal:     General: Bowel sounds are normal. There is distension.     Palpations: Abdomen is soft.     Tenderness: There is no abdominal tenderness. There is no rebound.  Musculoskeletal:     Cervical back: Normal range of motion.  Skin:    General: Skin is warm and dry.  Neurological:     Mental Status: She is alert and oriented to person, place, and time.     Coordination: Coordination normal.    Vitals:   04/27/22 0950  BP: 120/82  Pulse: 97  Resp: 18  SpO2: 97%  Weight: 261 lb 3.2 oz (118.5 kg)  Height: '5\' 5"'$  (1.651 m)    Assessment & Plan:

## 2022-04-27 NOTE — Assessment & Plan Note (Signed)
Prior levels low started levythyroxine 50 mcg daily. Did not return for follow up labs. TSH normal from ob/gyn end of March 2023 but no T4 done. Will check TSH and free T4 today and if normal plan to recheck mid-August at start of 3rd trimester.

## 2022-04-27 NOTE — Patient Instructions (Signed)
We will check the thyroid levels today and if normal plan to recheck in August.

## 2022-05-03 DIAGNOSIS — O99212 Obesity complicating pregnancy, second trimester: Secondary | ICD-10-CM | POA: Diagnosis not present

## 2022-05-03 DIAGNOSIS — O99282 Endocrine, nutritional and metabolic diseases complicating pregnancy, second trimester: Secondary | ICD-10-CM | POA: Diagnosis not present

## 2022-05-03 DIAGNOSIS — O3110X0 Continuing pregnancy after spontaneous abortion of one fetus or more, unspecified trimester, not applicable or unspecified: Secondary | ICD-10-CM | POA: Diagnosis not present

## 2022-05-03 DIAGNOSIS — Z3482 Encounter for supervision of other normal pregnancy, second trimester: Secondary | ICD-10-CM | POA: Diagnosis not present

## 2022-05-03 DIAGNOSIS — O09299 Supervision of pregnancy with other poor reproductive or obstetric history, unspecified trimester: Secondary | ICD-10-CM | POA: Diagnosis not present

## 2022-05-03 DIAGNOSIS — Z362 Encounter for other antenatal screening follow-up: Secondary | ICD-10-CM | POA: Diagnosis not present

## 2022-05-03 DIAGNOSIS — O2622 Pregnancy care for patient with recurrent pregnancy loss, second trimester: Secondary | ICD-10-CM | POA: Diagnosis not present

## 2022-05-03 DIAGNOSIS — O3120X0 Continuing pregnancy after intrauterine death of one fetus or more, unspecified trimester, not applicable or unspecified: Secondary | ICD-10-CM | POA: Diagnosis not present

## 2022-05-13 DIAGNOSIS — R5383 Other fatigue: Secondary | ICD-10-CM | POA: Diagnosis not present

## 2022-05-18 ENCOUNTER — Telehealth: Payer: Self-pay | Admitting: Internal Medicine

## 2022-05-18 NOTE — Telephone Encounter (Signed)
Pt called in and states her OB recommended she take Nuiron or Niserex for iron.   States she is unable to find it anywhere and was wanting our office to call her to give her an idea of where else to try.   Informed pt that she could call the office back that initially mentioned it to her for guidance on what else to try.   Pt states she has already done that, and would like a call back from our of,fice on where to find it?   Please advise.

## 2022-05-26 ENCOUNTER — Other Ambulatory Visit (HOSPITAL_COMMUNITY): Payer: Self-pay

## 2022-06-01 DIAGNOSIS — O99012 Anemia complicating pregnancy, second trimester: Secondary | ICD-10-CM | POA: Diagnosis not present

## 2022-06-01 DIAGNOSIS — O99282 Endocrine, nutritional and metabolic diseases complicating pregnancy, second trimester: Secondary | ICD-10-CM | POA: Diagnosis not present

## 2022-06-01 DIAGNOSIS — Z3482 Encounter for supervision of other normal pregnancy, second trimester: Secondary | ICD-10-CM | POA: Diagnosis not present

## 2022-06-01 DIAGNOSIS — O99212 Obesity complicating pregnancy, second trimester: Secondary | ICD-10-CM | POA: Diagnosis not present

## 2022-06-01 DIAGNOSIS — O3110X0 Continuing pregnancy after spontaneous abortion of one fetus or more, unspecified trimester, not applicable or unspecified: Secondary | ICD-10-CM | POA: Diagnosis not present

## 2022-06-20 DIAGNOSIS — O2623 Pregnancy care for patient with recurrent pregnancy loss, third trimester: Secondary | ICD-10-CM | POA: Diagnosis not present

## 2022-06-20 DIAGNOSIS — O99013 Anemia complicating pregnancy, third trimester: Secondary | ICD-10-CM | POA: Diagnosis not present

## 2022-06-20 DIAGNOSIS — O99283 Endocrine, nutritional and metabolic diseases complicating pregnancy, third trimester: Secondary | ICD-10-CM | POA: Diagnosis not present

## 2022-06-20 DIAGNOSIS — O99213 Obesity complicating pregnancy, third trimester: Secondary | ICD-10-CM | POA: Diagnosis not present

## 2022-06-20 DIAGNOSIS — Z3689 Encounter for other specified antenatal screening: Secondary | ICD-10-CM | POA: Diagnosis not present

## 2022-07-01 ENCOUNTER — Other Ambulatory Visit (HOSPITAL_COMMUNITY): Payer: Self-pay

## 2022-07-06 ENCOUNTER — Encounter (INDEPENDENT_AMBULATORY_CARE_PROVIDER_SITE_OTHER): Payer: Self-pay

## 2022-07-06 DIAGNOSIS — O99213 Obesity complicating pregnancy, third trimester: Secondary | ICD-10-CM | POA: Diagnosis not present

## 2022-07-06 DIAGNOSIS — Z3483 Encounter for supervision of other normal pregnancy, third trimester: Secondary | ICD-10-CM | POA: Diagnosis not present

## 2022-07-06 DIAGNOSIS — O99013 Anemia complicating pregnancy, third trimester: Secondary | ICD-10-CM | POA: Diagnosis not present

## 2022-07-06 DIAGNOSIS — O99283 Endocrine, nutritional and metabolic diseases complicating pregnancy, third trimester: Secondary | ICD-10-CM | POA: Diagnosis not present

## 2022-07-15 ENCOUNTER — Other Ambulatory Visit (HOSPITAL_COMMUNITY): Payer: Self-pay

## 2022-07-21 DIAGNOSIS — Z3483 Encounter for supervision of other normal pregnancy, third trimester: Secondary | ICD-10-CM | POA: Diagnosis not present

## 2022-07-21 DIAGNOSIS — O3110X0 Continuing pregnancy after spontaneous abortion of one fetus or more, unspecified trimester, not applicable or unspecified: Secondary | ICD-10-CM | POA: Diagnosis not present

## 2022-07-21 DIAGNOSIS — O99213 Obesity complicating pregnancy, third trimester: Secondary | ICD-10-CM | POA: Diagnosis not present

## 2022-07-21 DIAGNOSIS — O99283 Endocrine, nutritional and metabolic diseases complicating pregnancy, third trimester: Secondary | ICD-10-CM | POA: Diagnosis not present

## 2022-07-21 DIAGNOSIS — O99013 Anemia complicating pregnancy, third trimester: Secondary | ICD-10-CM | POA: Diagnosis not present

## 2022-08-04 DIAGNOSIS — O3120X0 Continuing pregnancy after intrauterine death of one fetus or more, unspecified trimester, not applicable or unspecified: Secondary | ICD-10-CM | POA: Diagnosis not present

## 2022-08-04 DIAGNOSIS — O99013 Anemia complicating pregnancy, third trimester: Secondary | ICD-10-CM | POA: Diagnosis not present

## 2022-08-04 DIAGNOSIS — O99213 Obesity complicating pregnancy, third trimester: Secondary | ICD-10-CM | POA: Diagnosis not present

## 2022-08-04 DIAGNOSIS — Z3483 Encounter for supervision of other normal pregnancy, third trimester: Secondary | ICD-10-CM | POA: Diagnosis not present

## 2022-08-04 DIAGNOSIS — O99283 Endocrine, nutritional and metabolic diseases complicating pregnancy, third trimester: Secondary | ICD-10-CM | POA: Diagnosis not present

## 2022-08-04 LAB — OB RESULTS CONSOLE GBS: GBS: NEGATIVE

## 2022-08-08 ENCOUNTER — Other Ambulatory Visit (HOSPITAL_COMMUNITY): Payer: Self-pay

## 2022-08-11 DIAGNOSIS — Z3483 Encounter for supervision of other normal pregnancy, third trimester: Secondary | ICD-10-CM | POA: Diagnosis not present

## 2022-08-11 DIAGNOSIS — O133 Gestational [pregnancy-induced] hypertension without significant proteinuria, third trimester: Secondary | ICD-10-CM | POA: Diagnosis not present

## 2022-08-11 DIAGNOSIS — O99013 Anemia complicating pregnancy, third trimester: Secondary | ICD-10-CM | POA: Diagnosis not present

## 2022-08-11 DIAGNOSIS — O99283 Endocrine, nutritional and metabolic diseases complicating pregnancy, third trimester: Secondary | ICD-10-CM | POA: Diagnosis not present

## 2022-08-11 DIAGNOSIS — O99213 Obesity complicating pregnancy, third trimester: Secondary | ICD-10-CM | POA: Diagnosis not present

## 2022-08-11 DIAGNOSIS — O1213 Gestational proteinuria, third trimester: Secondary | ICD-10-CM | POA: Diagnosis not present

## 2022-08-11 DIAGNOSIS — Z1589 Genetic susceptibility to other disease: Secondary | ICD-10-CM | POA: Diagnosis not present

## 2022-08-11 DIAGNOSIS — Z3685 Encounter for antenatal screening for Streptococcus B: Secondary | ICD-10-CM | POA: Diagnosis not present

## 2022-08-11 DIAGNOSIS — O3120X0 Continuing pregnancy after intrauterine death of one fetus or more, unspecified trimester, not applicable or unspecified: Secondary | ICD-10-CM | POA: Diagnosis not present

## 2022-08-17 DIAGNOSIS — O0993 Supervision of high risk pregnancy, unspecified, third trimester: Secondary | ICD-10-CM | POA: Diagnosis not present

## 2022-08-17 DIAGNOSIS — O133 Gestational [pregnancy-induced] hypertension without significant proteinuria, third trimester: Secondary | ICD-10-CM | POA: Diagnosis not present

## 2022-08-17 DIAGNOSIS — O1213 Gestational proteinuria, third trimester: Secondary | ICD-10-CM | POA: Diagnosis not present

## 2022-08-17 DIAGNOSIS — O99283 Endocrine, nutritional and metabolic diseases complicating pregnancy, third trimester: Secondary | ICD-10-CM | POA: Diagnosis not present

## 2022-08-17 DIAGNOSIS — O99013 Anemia complicating pregnancy, third trimester: Secondary | ICD-10-CM | POA: Diagnosis not present

## 2022-08-17 DIAGNOSIS — O99213 Obesity complicating pregnancy, third trimester: Secondary | ICD-10-CM | POA: Diagnosis not present

## 2022-08-24 ENCOUNTER — Other Ambulatory Visit: Payer: Self-pay

## 2022-08-24 ENCOUNTER — Inpatient Hospital Stay (HOSPITAL_COMMUNITY)
Admission: AD | Admit: 2022-08-24 | Discharge: 2022-08-28 | DRG: 768 | Disposition: A | Discharge status: Home / Self Care | Payer: 59 | Attending: Obstetrics and Gynecology | Admitting: Obstetrics and Gynecology

## 2022-08-24 ENCOUNTER — Encounter (HOSPITAL_COMMUNITY): Payer: Self-pay | Admitting: Obstetrics and Gynecology

## 2022-08-24 DIAGNOSIS — Z3A38 38 weeks gestation of pregnancy: Secondary | ICD-10-CM | POA: Diagnosis not present

## 2022-08-24 DIAGNOSIS — Z23 Encounter for immunization: Secondary | ICD-10-CM | POA: Diagnosis not present

## 2022-08-24 DIAGNOSIS — O0993 Supervision of high risk pregnancy, unspecified, third trimester: Secondary | ICD-10-CM | POA: Diagnosis not present

## 2022-08-24 DIAGNOSIS — O133 Gestational [pregnancy-induced] hypertension without significant proteinuria, third trimester: Secondary | ICD-10-CM | POA: Diagnosis not present

## 2022-08-24 DIAGNOSIS — O99214 Obesity complicating childbirth: Secondary | ICD-10-CM | POA: Diagnosis not present

## 2022-08-24 DIAGNOSIS — O114 Pre-existing hypertension with pre-eclampsia, complicating childbirth: Principal | ICD-10-CM | POA: Diagnosis present

## 2022-08-24 DIAGNOSIS — Z3A37 37 weeks gestation of pregnancy: Secondary | ICD-10-CM

## 2022-08-24 DIAGNOSIS — O1002 Pre-existing essential hypertension complicating childbirth: Secondary | ICD-10-CM | POA: Diagnosis not present

## 2022-08-24 DIAGNOSIS — O119 Pre-existing hypertension with pre-eclampsia, unspecified trimester: Secondary | ICD-10-CM | POA: Diagnosis present

## 2022-08-24 DIAGNOSIS — E039 Hypothyroidism, unspecified: Secondary | ICD-10-CM | POA: Diagnosis not present

## 2022-08-24 DIAGNOSIS — O1494 Unspecified pre-eclampsia, complicating childbirth: Secondary | ICD-10-CM | POA: Diagnosis not present

## 2022-08-24 DIAGNOSIS — O99013 Anemia complicating pregnancy, third trimester: Secondary | ICD-10-CM | POA: Diagnosis not present

## 2022-08-24 DIAGNOSIS — O2623 Pregnancy care for patient with recurrent pregnancy loss, third trimester: Secondary | ICD-10-CM | POA: Diagnosis not present

## 2022-08-24 DIAGNOSIS — O99284 Endocrine, nutritional and metabolic diseases complicating childbirth: Secondary | ICD-10-CM | POA: Diagnosis not present

## 2022-08-24 DIAGNOSIS — O99213 Obesity complicating pregnancy, third trimester: Secondary | ICD-10-CM | POA: Diagnosis not present

## 2022-08-24 DIAGNOSIS — O134 Gestational [pregnancy-induced] hypertension without significant proteinuria, complicating childbirth: Secondary | ICD-10-CM | POA: Diagnosis present

## 2022-08-24 DIAGNOSIS — O99283 Endocrine, nutritional and metabolic diseases complicating pregnancy, third trimester: Secondary | ICD-10-CM | POA: Diagnosis not present

## 2022-08-24 HISTORY — DX: Other specified health status: Z78.9

## 2022-08-24 LAB — COMPREHENSIVE METABOLIC PANEL
ALT: 22 U/L (ref 0–44)
AST: 28 U/L (ref 15–41)
Albumin: 2.7 g/dL — ABNORMAL LOW (ref 3.5–5.0)
Alkaline Phosphatase: 107 U/L (ref 38–126)
Anion gap: 7 (ref 5–15)
BUN: 5 mg/dL — ABNORMAL LOW (ref 6–20)
CO2: 21 mmol/L — ABNORMAL LOW (ref 22–32)
Calcium: 8.3 mg/dL — ABNORMAL LOW (ref 8.9–10.3)
Chloride: 109 mmol/L (ref 98–111)
Creatinine, Ser: 0.67 mg/dL (ref 0.44–1.00)
GFR, Estimated: >60 mL/min (ref >60)
Glucose, Bld: 115 mg/dL — ABNORMAL HIGH (ref 70–99)
Potassium: 3.2 mmol/L — ABNORMAL LOW (ref 3.5–5.1)
Sodium: 137 mmol/L (ref 135–145)
Total Bilirubin: 0.4 mg/dL (ref 0.3–1.2)
Total Protein: 6.2 g/dL — ABNORMAL LOW (ref 6.5–8.1)

## 2022-08-24 LAB — CBC
HCT: 34.5 % — ABNORMAL LOW (ref 36.0–46.0)
Hemoglobin: 11.4 g/dL — ABNORMAL LOW (ref 12.0–15.0)
MCH: 28.1 pg (ref 26.0–34.0)
MCHC: 33 g/dL (ref 30.0–36.0)
MCV: 85.2 fL (ref 80.0–100.0)
Platelets: 380 10*3/uL (ref 150–400)
RBC: 4.05 MIL/uL (ref 3.87–5.11)
RDW: 14.3 % (ref 11.5–15.5)
WBC: 12 10*3/uL — ABNORMAL HIGH (ref 4.0–10.5)
nRBC: 0 % (ref 0.0–0.2)

## 2022-08-24 LAB — TYPE AND SCREEN
ABO/RH(D): B POS
Antibody Screen: NEGATIVE

## 2022-08-24 LAB — PROTEIN / CREATININE RATIO, URINE
Creatinine, Urine: 63 mg/dL
Protein Creatinine Ratio: 0.27 mg/mg{Cre} — ABNORMAL HIGH (ref 0.00–0.15)
Total Protein, Urine: 17 mg/dL

## 2022-08-24 LAB — MAGNESIUM: Magnesium: 3.6 mg/dL — ABNORMAL HIGH (ref 1.7–2.4)

## 2022-08-24 MED ORDER — POTASSIUM CHLORIDE CRYS ER 20 MEQ PO TBCR
40.0000 meq | EXTENDED_RELEASE_TABLET | Freq: Two times a day (BID) | ORAL | Status: DC
  Administered 2022-08-24 – 2022-08-26 (×4): 40 meq via ORAL
  Filled 2022-08-24 (×7): qty 2

## 2022-08-24 MED ORDER — OXYCODONE-ACETAMINOPHEN 5-325 MG PO TABS: 1.0000 | ORAL_TABLET | ORAL | Status: DC | PRN

## 2022-08-24 MED ORDER — LABETALOL HCL 5 MG/ML IV SOLN
20.0000 mg | INTRAVENOUS | Status: DC | PRN
  Filled 2022-08-24 (×2): qty 4

## 2022-08-24 MED ORDER — LABETALOL HCL 5 MG/ML IV SOLN
20.0000 mg | INTRAVENOUS | Status: DC | PRN
  Administered 2022-08-24 – 2022-08-25 (×3): 20 mg via INTRAVENOUS
  Filled 2022-08-24: qty 4

## 2022-08-24 MED ORDER — OXYTOCIN-SODIUM CHLORIDE 30-0.9 UT/500ML-% IV SOLN
2.5000 [IU]/h | INTRAVENOUS | Status: DC
  Administered 2022-08-26: 2.5 [IU]/h via INTRAVENOUS
  Filled 2022-08-24 (×3): qty 500

## 2022-08-24 MED ORDER — TERBUTALINE SULFATE 1 MG/ML IJ SOLN: 0.2500 mg | Freq: Once | INTRAMUSCULAR | Status: DC | PRN

## 2022-08-24 MED ORDER — LACTATED RINGERS IV SOLN: INTRAVENOUS | Status: DC

## 2022-08-24 MED ORDER — MAGNESIUM SULFATE BOLUS VIA INFUSION
4.0000 g | Freq: Once | INTRAVENOUS | Status: AC
  Administered 2022-08-24: 4 g via INTRAVENOUS
  Filled 2022-08-24: qty 1000

## 2022-08-24 MED ORDER — OXYTOCIN-SODIUM CHLORIDE 30-0.9 UT/500ML-% IV SOLN
1.0000 m[IU]/min | INTRAVENOUS | Status: DC
  Administered 2022-08-24: 2 m[IU]/min via INTRAVENOUS

## 2022-08-24 MED ORDER — SOD CITRATE-CITRIC ACID 500-334 MG/5ML PO SOLN: 30.0000 mL | ORAL | Status: DC | PRN

## 2022-08-24 MED ORDER — LABETALOL HCL 5 MG/ML IV SOLN: 80.0000 mg | INTRAVENOUS | Status: DC | PRN

## 2022-08-24 MED ORDER — OXYCODONE-ACETAMINOPHEN 5-325 MG PO TABS: 2.0000 | ORAL_TABLET | ORAL | Status: DC | PRN

## 2022-08-24 MED ORDER — ONDANSETRON HCL 4 MG/2ML IJ SOLN
4.0000 mg | Freq: Four times a day (QID) | INTRAMUSCULAR | Status: DC | PRN
  Administered 2022-08-25 – 2022-08-26 (×3): 4 mg via INTRAVENOUS
  Filled 2022-08-24 (×3): qty 2

## 2022-08-24 MED ORDER — LEVOTHYROXINE SODIUM 25 MCG PO TABS
75.0000 ug | ORAL_TABLET | Freq: Every day | ORAL | Status: DC
  Administered 2022-08-25 – 2022-08-28 (×4): 75 ug via ORAL
  Filled 2022-08-24: qty 3
  Filled 2022-08-24 (×2): qty 1
  Filled 2022-08-24 (×2): qty 3

## 2022-08-24 MED ORDER — HYDRALAZINE HCL 20 MG/ML IJ SOLN: 10.0000 mg | INTRAMUSCULAR | Status: DC | PRN

## 2022-08-24 MED ORDER — MAGNESIUM SULFATE 40 GM/1000ML IV SOLN
2.0000 g/h | INTRAVENOUS | Status: DC
  Administered 2022-08-25: 2 g/h via INTRAVENOUS
  Filled 2022-08-24 (×2): qty 1000

## 2022-08-24 MED ORDER — ACETAMINOPHEN 325 MG PO TABS
650.0000 mg | ORAL_TABLET | ORAL | Status: DC | PRN
  Administered 2022-08-25: 650 mg via ORAL
  Filled 2022-08-24: qty 2

## 2022-08-24 MED ORDER — OXYTOCIN BOLUS FROM INFUSION
333.0000 mL | Freq: Once | INTRAVENOUS | Status: AC
  Administered 2022-08-26: 333 mL via INTRAVENOUS

## 2022-08-24 MED ORDER — LACTATED RINGERS IV SOLN
500.0000 mL | INTRAVENOUS | Status: DC | PRN
  Administered 2022-08-25: 500 mL via INTRAVENOUS

## 2022-08-24 MED ORDER — LABETALOL HCL 5 MG/ML IV SOLN: 40.0000 mg | INTRAVENOUS | Status: DC | PRN

## 2022-08-24 MED ORDER — HYDRALAZINE HCL 50 MG PO TABS
50.0000 mg | ORAL_TABLET | Freq: Three times a day (TID) | ORAL | Status: DC
  Administered 2022-08-24 – 2022-08-28 (×11): 50 mg via ORAL
  Filled 2022-08-24 (×16): qty 1

## 2022-08-24 MED ORDER — LIDOCAINE HCL (PF) 1 % IJ SOLN: 30.0000 mL | INTRAMUSCULAR | Status: DC | PRN

## 2022-08-24 MED ORDER — LABETALOL HCL 5 MG/ML IV SOLN
80.0000 mg | INTRAVENOUS | Status: DC | PRN
  Filled 2022-08-24: qty 16

## 2022-08-24 MED ORDER — LABETALOL HCL 5 MG/ML IV SOLN
40.0000 mg | INTRAVENOUS | Status: DC | PRN
  Administered 2022-08-24 – 2022-08-25 (×2): 40 mg via INTRAVENOUS
  Filled 2022-08-24: qty 8

## 2022-08-24 MED ORDER — OXYTOCIN 10 UNIT/ML IJ SOLN: 10.0000 [IU] | Freq: Once | INTRAMUSCULAR | Status: DC

## 2022-08-24 NOTE — MAU Provider Note (Signed)
Event Date/Time   First Provider Initiated Contact with Patient 08/24/22 1607      S Ms. Brandi Wise is a 31 y.o. G3P0010 patient who presents to MAU today after being sent from the office for PreE work up. She denies Headache, blurred vision, and epigastric pain. Denies vaginal bleeding, leaking of fluid or contractions.  Endorses Positive fetal movement.     O BP (!) 174/98 (BP Location: Right Arm)   Pulse 87   Temp 98.3 F (36.8 C) (Oral)   Resp 20   Ht '5\' 5"'$  (1.651 m)   Wt 130.4 kg   LMP 09/24/2021 (Approximate)   SpO2 99%   BMI 47.84 kg/m  Physical Exam Vitals and nursing note reviewed.  Constitutional:      General: She is not in acute distress.    Appearance: Normal appearance.  HENT:     Head: Normocephalic.  Pulmonary:     Effort: Pulmonary effort is normal.  Musculoskeletal:     Cervical back: Normal range of motion.  Skin:    General: Skin is warm and dry.  Neurological:     Mental Status: She is alert and oriented to person, place, and time.  Psychiatric:        Mood and Affect: Mood normal.    Patient Vitals for the past 24 hrs:  BP Temp Temp src Pulse Resp SpO2 Height Weight  08/24/22 1530 (!) 174/98 -- -- 87 -- 99 % -- --  08/24/22 1521 (!) 173/101 98.3 F (36.8 C) Oral 92 20 99 % -- --  08/24/22 1510 -- -- -- -- -- -- '5\' 5"'$  (1.651 m) 130.4 kg    A - Medical screening exam complete Patient stable.  - Dr. Garwin Brothers Managing patient care with IV labetalol and Pre E lab work.   - RNs currently attempting to get IV started   Deloris Ping, [place] 08/24/2022 4:07 PM

## 2022-08-24 NOTE — Procedures (Signed)
Performed straight catheterization to obtain urine specimen for protein/creatinine ratio, minute amount of urine obtained.  Specimen not sent secondary lack of urine.

## 2022-08-24 NOTE — H&P (Signed)
Brandi Wise is a 31 y.o. female presenting for IOL @ 47 5/[redacted] wk gestation due to severe range BP noted in the office ( 166/99  and confirmed  on arrival to hospital. Pt had intermittent  h/a today. Denies epigastric pain or visual changes. (+) leg swelling  OB History     Gravida  3   Para      Term      Preterm      AB  1   Living         SAB  1   IAB      Ectopic      Multiple      Live Births             Past Medical History:  Diagnosis Date   Knee pain    Medical history non-contributory    Pre-diabetes    Past Surgical History:  Procedure Laterality Date   POLYPECTOMY     THYROIDECTOMY Left 05/19/2021   Procedure: THYROID LOBECTOMY;  Surgeon: Melida Quitter, MD;  Location: [facility] OR;  Service: ENT;  Laterality: Left;   Family History: family history includes Drug abuse in her mother; Healthy in her father. She was adopted. Social History:  reports that she has never smoked. She has never used smokeless tobacco. She reports current alcohol use. She reports that she does not use drugs.     Maternal Diabetes: No Genetic Screening: Normal Maternal Ultrasounds/Referrals: Normal Fetal Ultrasounds or other Referrals:  None Maternal Substance Abuse:  No Significant Maternal Medications:  Meds include: Syntroid Significant Maternal Lab Results:  Group B Strep negative Number of Prenatal Visits:greater than 3 verified prenatal visits Other Comments:   hypothyroidism , initially twin gestation with demise of 2nd twin  first trimester  Review of Systems  Eyes:  Negative for visual disturbance.  Cardiovascular:  Positive for leg swelling.  Neurological:        Intermittent h/a   History   Blood pressure (!) 158/95, pulse 88, temperature 98.5 F (36.9 C), temperature source Oral, resp. rate 18, height '5\' 5"'$  (1.651 m), weight 130.4 kg, last menstrual period 09/24/2021, SpO2 99 %, unknown if currently breastfeeding. Exam Physical Exam Constitutional:       Appearance: Normal appearance. She is obese.  HENT:     Head: Atraumatic.  Eyes:     Extraocular Movements: Extraocular movements intact.  Cardiovascular:     Rate and Rhythm: Regular rhythm.     Heart sounds: Normal heart sounds.  Pulmonary:     Breath sounds: Normal breath sounds.  Abdominal:     Comments: gravid  Genitourinary:    Comments: 1/80/-2 Musculoskeletal:        General: Swelling present.     Cervical back: Neck supple.  Skin:    General: Skin is warm and dry.  Neurological:     General: No focal deficit present.     Mental Status: She is alert and oriented to person, place, and time.  Psychiatric:        Mood and Affect: Mood normal.        Behavior: Behavior normal.     Prenatal labs: ABO, Rh: --/--/B POS (09/27 1600) Antibody: NEG (09/27 1600) Rubella:  Immune RPR:   NR HBsAg:   neg HIV:   neg GBS:   neg  Assessment/Plan: Severe gestational HTN Obesity affecting pregnancy Hypothyroidism affecting pregnancy IUP @ 37 5/7 wk P) admit [place] labs. Magnesium sulfate for sz prophylaxis, Pitocin induction. Start hydralazine.  HTN protocol. Continue synthroid    Brandi Wise 08/24/2022, 6:16 PM

## 2022-08-24 NOTE — Progress Notes (Signed)
S: some cramping SROM clear fluid O: Patient Vitals for the past 24 hrs:  BP Temp Temp src Pulse Resp SpO2 Height Weight  08/24/22 2301 (!) 147/90 98.7 F (37.1 C) Oral 87 15 -- -- --  08/24/22 2201 (!) 145/90 -- -- 89 15 -- -- --  08/24/22 2101 (!) 155/93 -- -- 87 -- -- -- --  08/24/22 2031 (!) 149/91 98.3 F (36.8 C) Oral 89 15 -- -- --  08/24/22 2016 (!) 155/81 -- -- 90 -- -- -- --  08/24/22 2001 (!) 155/89 -- -- 90 16 -- -- --  08/24/22 1946 (!) 160/103 -- -- 94 -- -- -- --  08/24/22 1931 (!) 161/93 -- -- 82 -- -- -- --  08/24/22 1901 130/82 -- -- 88 20 -- -- --  08/24/22 1847 (!) 156/90 -- -- 91 18 -- -- --  08/24/22 1819 (!) 170/110 -- -- 86 20 -- -- --  08/24/22 1800 (!) 158/95 -- -- 88 18 -- -- --  08/24/22 1749 -- 98.5 F (36.9 C) Oral -- (!) 22 -- -- --  08/24/22 1730 (!) 157/94 -- -- 85 -- 99 % -- --  08/24/22 1720 (!) 156/95 -- -- 95 19 98 % -- --  08/24/22 1710 (!) 144/91 -- -- 93 18 97 % -- --  08/24/22 1700 (!) 150/97 -- -- 84 20 98 % -- --  08/24/22 1645 (!) 156/94 -- -- 96 -- 97 % -- --  08/24/22 1630 (!) 145/107 -- -- 88 -- 97 % -- --  08/24/22 1617 (!) 145/87 -- -- 94 19 97 % -- --  08/24/22 1600 (!) 151/95 -- -- 87 -- 98 % -- --  08/24/22 1546 (!) 169/98 -- -- 86 -- 99 % -- --  08/24/22 1530 (!) 174/98 -- -- 87 -- 99 % -- --  08/24/22 1521 (!) 173/101 98.3 F (36.8 C) Oral 92 20 99 % -- --  08/24/22 1510 -- -- -- -- -- -- '5\' 5"'$  (1.651 m) 130.4 kg   Pitocin 2 miu Abdomen: gravid soft Pelvic deferred Ext 2+ edema  DTR 2+ (-) clonus  Tracing: baseline 150 (+) accel 165 Ctx q 2-4 mins     Latest Ref Rng & Units 08/24/2022    4:08 PM 05/12/2021    2:00 PM 12/25/2020    8:42 AM  CBC  WBC 4.0 - 10.5 K/uL 12.0  9.6  CANCELED   Hemoglobin 12.0 - 15.0 g/dL 11.4  12.5    Hematocrit 36.0 - 46.0 % 34.5  38.5    Platelets 150 - 400 K/uL 380  415         Latest Ref Rng & Units 08/24/2022    4:08 PM 06/17/2021   10:21 AM 05/12/2021    2:00 PM  CMP  Glucose 70  - 99 mg/dL 115  78  73   BUN 6 - 20 mg/dL '5  12  16   '$ Creatinine 0.44 - 1.00 mg/dL 0.67  0.81  0.84   Sodium 135 - 145 mmol/L 137  136  136   Potassium 3.5 - 5.1 mmol/L 3.2  4.7  4.1   Chloride 98 - 111 mmol/L 109  101  105   CO2 22 - 32 mmol/L '21  22  23   '$ Calcium 8.9 - 10.3 mg/dL 8.3  8.7  8.5   Total Protein 6.5 - 8.1 g/dL 6.2  7.4    Total Bilirubin 0.3 - 1.2 mg/dL 0.4  0.3    Alkaline Phos 38 - 126 U/L 107  64    AST 15 - 41 U/L 28  26    ALT 0 - 44 U/L 22  12     IMP' severe gestation HTN on hydralazine, IV magnesium sulfate Obesity affecting pregnancy IUP @ 37 5/7 wk Hypothyroidism  Hypokalemia P) cont with pitocin. Replete potassium. Mag level pending. Analgesic prn. Cont hydralazine

## 2022-08-24 NOTE — MAU Note (Signed)
Brandi Wise is a 31 y.o. at 81w5dhere in MAU reporting[facility].  Denies H/A, visual disturbances, and epigastric pain.  Endorses H/A earlier today.  Endorses +FM.  Denies VB or LOF. LMP: N/A Onset of complaint: today Pain score: 0 Vitals:   08/24/22 1521  BP: (!) 173/101  Pulse: 92  Resp: 20  Temp: 98.3 F (36.8 C)  SpO2: 99%     FHT:149 bpm Lab orders placed from triage:   UA

## 2022-08-25 ENCOUNTER — Inpatient Hospital Stay (HOSPITAL_COMMUNITY): Payer: 59 | Admitting: Anesthesiology

## 2022-08-25 LAB — CBC
HCT: 33.3 % — ABNORMAL LOW (ref 36.0–46.0)
HCT: 33.7 % — ABNORMAL LOW (ref 36.0–46.0)
Hemoglobin: 11.1 g/dL — ABNORMAL LOW (ref 12.0–15.0)
Hemoglobin: 11.5 g/dL — ABNORMAL LOW (ref 12.0–15.0)
MCH: 28 pg (ref 26.0–34.0)
MCH: 28.4 pg (ref 26.0–34.0)
MCHC: 33.3 g/dL (ref 30.0–36.0)
MCHC: 34.1 g/dL (ref 30.0–36.0)
MCV: 83.9 fL (ref 80.0–100.0)
MCV: 83.2 fL (ref 80.0–100.0)
Platelets: 366 10*3/uL (ref 150–400)
Platelets: 349 10*3/uL (ref 150–400)
RBC: 3.97 MIL/uL (ref 3.87–5.11)
RBC: 4.05 MIL/uL (ref 3.87–5.11)
RDW: 14.5 % (ref 11.5–15.5)
RDW: 14.6 % (ref 11.5–15.5)
WBC: 16 10*3/uL — ABNORMAL HIGH (ref 4.0–10.5)
WBC: 15.6 10*3/uL — ABNORMAL HIGH (ref 4.0–10.5)
nRBC: 0 % (ref 0.0–0.2)
nRBC: 0 % (ref 0.0–0.2)

## 2022-08-25 LAB — COMPREHENSIVE METABOLIC PANEL
ALT: 21 U/L (ref 0–44)
AST: 24 U/L (ref 15–41)
Albumin: 2.5 g/dL — ABNORMAL LOW (ref 3.5–5.0)
Alkaline Phosphatase: 96 U/L (ref 38–126)
Anion gap: 6 (ref 5–15)
BUN: <5 mg/dL — ABNORMAL LOW (ref 6–20)
CO2: 20 mmol/L — ABNORMAL LOW (ref 22–32)
Calcium: 7.2 mg/dL — ABNORMAL LOW (ref 8.9–10.3)
Chloride: 108 mmol/L (ref 98–111)
Creatinine, Ser: 0.6 mg/dL (ref 0.44–1.00)
GFR, Estimated: >60 mL/min (ref >60)
Glucose, Bld: 94 mg/dL (ref 70–99)
Potassium: 3.1 mmol/L — ABNORMAL LOW (ref 3.5–5.1)
Sodium: 134 mmol/L — ABNORMAL LOW (ref 135–145)
Total Bilirubin: 0.5 mg/dL (ref 0.3–1.2)
Total Protein: 5.8 g/dL — ABNORMAL LOW (ref 6.5–8.1)

## 2022-08-25 LAB — RPR: RPR Ser Ql: NONREACTIVE

## 2022-08-25 LAB — MAGNESIUM: Magnesium: 4.2 mg/dL — ABNORMAL HIGH (ref 1.7–2.4)

## 2022-08-25 MED ORDER — DIPHENHYDRAMINE HCL 50 MG/ML IJ SOLN: 12.5000 mg | INTRAMUSCULAR | Status: DC | PRN

## 2022-08-25 MED ORDER — LACTATED RINGERS IV SOLN
500.0000 mL | Freq: Once | INTRAVENOUS | Status: AC
  Administered 2022-08-25: 500 mL via INTRAVENOUS

## 2022-08-25 MED ORDER — LABETALOL HCL 100 MG PO TABS
100.0000 mg | ORAL_TABLET | Freq: Two times a day (BID) | ORAL | Status: DC
  Administered 2022-08-25 (×2): 100 mg via ORAL
  Filled 2022-08-25 (×2): qty 1

## 2022-08-25 MED ORDER — EPHEDRINE 5 MG/ML INJ: 10.0000 mg | INTRAVENOUS | Status: DC | PRN

## 2022-08-25 MED ORDER — LIDOCAINE HCL (PF) 1 % IJ SOLN
INTRAMUSCULAR | Status: DC | PRN
  Administered 2022-08-25: 6 mL via EPIDURAL
  Administered 2022-08-25: 4 mL via EPIDURAL

## 2022-08-25 MED ORDER — MAGNESIUM SULFATE BOLUS VIA INFUSION
2.0000 g | Freq: Once | INTRAVENOUS | Status: AC
  Administered 2022-08-25: 2 g via INTRAVENOUS
  Filled 2022-08-25: qty 1000

## 2022-08-25 MED ORDER — MAGNESIUM SULFATE 2 GM/50ML IV SOLN: 2.0000 g | Freq: Once | INTRAVENOUS | Status: DC

## 2022-08-25 MED ORDER — PHENYLEPHRINE 80 MCG/ML (10ML) SYRINGE FOR IV PUSH (FOR BLOOD PRESSURE SUPPORT)
80.0000 ug | PREFILLED_SYRINGE | INTRAVENOUS | Status: DC | PRN
  Filled 2022-08-25: qty 10

## 2022-08-25 MED ORDER — OXYTOCIN-SODIUM CHLORIDE 30-0.9 UT/500ML-% IV SOLN: 1.0000 m[IU]/min | INTRAVENOUS | Status: DC

## 2022-08-25 MED ORDER — OXYTOCIN-SODIUM CHLORIDE 30-0.9 UT/500ML-% IV SOLN
1.0000 m[IU]/min | INTRAVENOUS | Status: DC
  Administered 2022-08-25: 4 m[IU]/min via INTRAVENOUS

## 2022-08-25 MED ORDER — FENTANYL-BUPIVACAINE-NACL 0.5-0.125-0.9 MG/250ML-% EP SOLN
12.0000 mL/h | EPIDURAL | Status: DC | PRN
  Administered 2022-08-25: 12 mL/h via EPIDURAL
  Filled 2022-08-25: qty 250

## 2022-08-25 MED ORDER — PHENYLEPHRINE 80 MCG/ML (10ML) SYRINGE FOR IV PUSH (FOR BLOOD PRESSURE SUPPORT)
80.0000 ug | PREFILLED_SYRINGE | INTRAVENOUS | Status: DC | PRN
  Administered 2022-08-25: 80 ug via INTRAVENOUS

## 2022-08-25 MED ORDER — TERBUTALINE SULFATE 1 MG/ML IJ SOLN: 0.2500 mg | Freq: Once | INTRAMUSCULAR | Status: DC | PRN

## 2022-08-25 NOTE — Progress Notes (Signed)
S: tired Some cramping SROM  O: VS BP 150/94 P92 Pitocin 8 miu VE deferred  Tracing: baseline 135 (+) accel to 150 Ctx q 3-5 mins  IMP: severe gestational hypertension on labetalol and hydralazine Obesity affecting pregnancy P) cont present mgmt

## 2022-08-25 NOTE — Progress Notes (Signed)
S: notes back pain (+) contractions SROM since 7 PM  O: VS BP 146/92 P 95 Pitocin 20 miu VE 4/80/-2 deviated to left Extr 2+ edema DTR 1+ Tracing: baseline 135 (+) accel Ctx q 2-4 mins  IMP: Latent /active phase Severe gestational HTN on labetalol and hydralazine, IV Magnesium sulfate Obesity affecting preg SROM x 24 hrs P) Epidural cont increase pitocin. Exaggerated movement in circuit

## 2022-08-25 NOTE — Progress Notes (Signed)
S: no complaint O: Patient Vitals for the past 24 hrs:  BP Temp Temp src Pulse Resp SpO2 Height Weight  08/25/22 0601 (!) 142/92 -- -- 97 17 -- -- --  08/25/22 0501 (!) 142/96 98.3 F (36.8 C) Oral (!) 102 15 -- -- --  08/25/22 0408 (!) 141/78 -- -- 91 -- -- -- --  08/25/22 0301 (!) 143/72 98.9 F (37.2 C) Oral 92 17 -- -- --  08/25/22 0202 (!) 159/81 -- -- 97 -- -- -- --  08/25/22 0059 (!) 150/81 98.6 F (37 C) Axillary 94 17 -- -- --  08/25/22 0046 (!) 149/78 98.9 F (37.2 C) Axillary 90 19 -- -- --  08/25/22 0007 131/70 -- -- 87 -- -- -- --  08/24/22 2301 (!) 147/90 98.7 F (37.1 C) Oral 87 15 -- -- --  08/24/22 2201 (!) 145/90 -- -- 89 15 -- -- --  08/24/22 2101 (!) 155/93 -- -- 87 -- -- -- --  08/24/22 2031 (!) 149/91 98.3 F (36.8 C) Oral 89 15 -- -- --  08/24/22 2016 (!) 155/81 -- -- 90 -- -- -- --  08/24/22 2001 (!) 155/89 -- -- 90 16 -- -- --  08/24/22 1946 (!) 160/103 -- -- 94 -- -- -- --  08/24/22 1931 (!) 161/93 -- -- 82 -- -- -- --  08/24/22 1901 130/82 -- -- 88 20 -- -- --  08/24/22 1847 (!) 156/90 -- -- 91 18 -- -- --  08/24/22 1819 (!) 170/110 -- -- 86 20 -- -- --  08/24/22 1800 (!) 158/95 -- -- 88 18 -- -- --  08/24/22 1749 -- 98.5 F (36.9 C) Oral -- (!) 22 -- -- --  08/24/22 1730 (!) 157/94 -- -- 85 -- 99 % -- --  08/24/22 1720 (!) 156/95 -- -- 95 19 98 % -- --  08/24/22 1710 (!) 144/91 -- -- 93 18 97 % -- --  08/24/22 1700 (!) 150/97 -- -- 84 20 98 % -- --  08/24/22 1645 (!) 156/94 -- -- 96 -- 97 % -- --  08/24/22 1630 (!) 145/107 -- -- 88 -- 97 % -- --  08/24/22 1617 (!) 145/87 -- -- 94 19 97 % -- --  08/24/22 1600 (!) 151/95 -- -- 87 -- 98 % -- --  08/24/22 1546 (!) 169/98 -- -- 86 -- 99 % -- --  08/24/22 1530 (!) 174/98 -- -- 87 -- 99 % -- --  08/24/22 1521 (!) 173/101 98.3 F (36.8 C) Oral 92 20 99 % -- --  08/24/22 1510 -- -- -- -- -- -- '5\' 5"'$  (1.651 m) 130.4 kg    Pitocin 24 miu VE deferred  Extr 2+ edema  Results for orders placed or  performed during the hospital encounter of 08/24/22 (from the past 24 hour(s))  Type and screen [place]     Status: None   Collection Time: 08/24/22  4:00 PM  Result Value Ref Range   ABO/RH(D) B POS    Antibody Screen NEG    Sample Expiration      08/27/2022,2359 Performed at [facility], [place] [address], [place] [address]   CBC     Status: Abnormal   Collection Time: 08/24/22  4:08 PM  Result Value Ref Range   WBC 12.0 (H) 4.0 - 10.5 K/uL   RBC 4.05 3.87 - 5.11 MIL/uL   Hemoglobin 11.4 (L) 12.0 - 15.0 g/dL   HCT 34.5 (L)  36.0 - 46.0 %   MCV 85.2 80.0 - 100.0 fL   MCH 28.1 26.0 - 34.0 pg   MCHC 33.0 30.0 - 36.0 g/dL   RDW 14.3 11.5 - 15.5 %   Platelets 380 150 - 400 K/uL   nRBC 0.0 0.0 - 0.2 %  Comprehensive metabolic panel     Status: Abnormal   Collection Time: 08/24/22  4:08 PM  Result Value Ref Range   Sodium 137 135 - 145 mmol/L   Potassium 3.2 (L) 3.5 - 5.1 mmol/L   Chloride 109 98 - 111 mmol/L   CO2 21 (L) 22 - 32 mmol/L   Glucose, Bld 115 (H) 70 - 99 mg/dL   BUN 5 (L) 6 - 20 mg/dL   Creatinine, Ser 0.67 0.44 - 1.00 mg/dL   Calcium 8.3 (L) 8.9 - 10.3 mg/dL   Total Protein 6.2 (L) 6.5 - 8.1 g/dL   Albumin 2.7 (L) 3.5 - 5.0 g/dL   AST 28 15 - 41 U/L   ALT 22 0 - 44 U/L   Alkaline Phosphatase 107 38 - 126 U/L   Total Bilirubin 0.4 0.3 - 1.2 mg/dL   GFR, Estimated >60 >60 mL/min   Anion gap 7 5 - 15  Protein / creatinine ratio, urine     Status: Abnormal   Collection Time: 08/24/22  6:52 PM  Result Value Ref Range   Creatinine, Urine 63 mg/dL   Total Protein, Urine 17 mg/dL   Protein Creatinine Ratio 0.27 (H) 0.00 - 0.15 mg/mg[Cre]  Magnesium     Status: Abnormal   Collection Time: 08/24/22 10:59 PM  Result Value Ref Range   Magnesium 3.6 (H) 1.7 - 2.4 mg/dL  Tracing: baseline 140 (+) accels Ctx q 1-4 mins  IMP: severe  gestational HTN On Magnesium, hydralazine Obesity affecting pregnancy IUP @ 37 6/7 Hypokalemia on  K  repletion P) down regulation with pitocin. Cont magnesium. Exaggerated sims Will add labetalol.

## 2022-08-25 NOTE — Anesthesia Procedure Notes (Signed)
Epidural Patient location during procedure: OB Start time: 08/25/2022 8:10 PM End time: 08/25/2022 8:22 PM  Staffing Anesthesiologist: Lidia Collum, MD Performed: anesthesiologist   Preanesthetic Checklist Completed: patient identified, IV checked, risks and benefits discussed, monitors and equipment checked, pre-op evaluation and timeout performed  Epidural Patient position: sitting Prep: DuraPrep Patient monitoring: heart rate, continuous pulse ox and blood pressure Approach: midline Location: L3-L4 Injection technique: LOR air  Needle:  Needle type: Tuohy  Needle gauge: 17 G Needle length: 9 cm Needle insertion depth: 9 cm Catheter type: closed end flexible Catheter size: 19 Gauge Catheter at skin depth: 15 cm Test dose: negative  Assessment Events: blood not aspirated, injection not painful, no injection resistance, no paresthesia and negative IV test  Additional Notes Reason for block:procedure for pain

## 2022-08-25 NOTE — Anesthesia Preprocedure Evaluation (Signed)
Anesthesia Evaluation  Patient identified by MRN, date of birth, ID band Patient awake    Reviewed: Allergy & Precautions, H&P , NPO status , Patient's Chart, lab work & pertinent test results  History of Anesthesia Complications Negative for: history of anesthetic complications  Airway Mallampati: III  TM Distance: >3 FB     Dental   Pulmonary neg pulmonary ROS,    Pulmonary exam normal        Cardiovascular hypertension (chronic HTN w/ superimposed pre-e),  Rhythm:regular Rate:Normal     Neuro/Psych negative neurological ROS  negative psych ROS   GI/Hepatic negative GI ROS, Neg liver ROS,   Endo/Other  Hypothyroidism Morbid obesity  Renal/GU      Musculoskeletal   Abdominal   Peds  Hematology negative hematology ROS (+)   Anesthesia Other Findings   Reproductive/Obstetrics (+) Pregnancy                             Anesthesia Physical Anesthesia Plan  ASA: 3  Anesthesia Plan: Epidural   Post-op Pain Management:    Induction:   PONV Risk Score and Plan:   Airway Management Planned:   Additional Equipment:   Intra-op Plan:   Post-operative Plan:   Informed Consent: I have reviewed the patients History and Physical, chart, labs and discussed the procedure including the risks, benefits and alternatives for the proposed anesthesia with the patient or authorized representative who has indicated his/her understanding and acceptance.       Plan Discussed with:   Anesthesia Plan Comments:         Anesthesia Quick Evaluation

## 2022-08-26 ENCOUNTER — Encounter (HOSPITAL_COMMUNITY): Payer: Self-pay | Admitting: Obstetrics and Gynecology

## 2022-08-26 LAB — CBC WITH DIFFERENTIAL/PLATELET
Abs Immature Granulocytes: 0.12 10*3/uL — ABNORMAL HIGH (ref 0.00–0.07)
Basophils Absolute: 0.1 10*3/uL (ref 0.0–0.1)
Basophils Relative: 0 %
Eosinophils Absolute: 0 10*3/uL (ref 0.0–0.5)
Eosinophils Relative: 0 %
HCT: 36.7 % (ref 36.0–46.0)
Hemoglobin: 12.1 g/dL (ref 12.0–15.0)
Immature Granulocytes: 1 %
Lymphocytes Relative: 5 %
Lymphs Abs: 1.1 10*3/uL (ref 0.7–4.0)
MCH: 28.5 pg (ref 26.0–34.0)
MCHC: 33 g/dL (ref 30.0–36.0)
MCV: 86.4 fL (ref 80.0–100.0)
Monocytes Absolute: 1 10*3/uL (ref 0.1–1.0)
Monocytes Relative: 5 %
Neutro Abs: 18.1 10*3/uL — ABNORMAL HIGH (ref 1.7–7.7)
Neutrophils Relative %: 89 %
Platelets: 396 10*3/uL (ref 150–400)
RBC: 4.25 MIL/uL (ref 3.87–5.11)
RDW: 14.9 % (ref 11.5–15.5)
WBC: 20.4 10*3/uL — ABNORMAL HIGH (ref 4.0–10.5)
nRBC: 0 % (ref 0.0–0.2)

## 2022-08-26 LAB — COMPREHENSIVE METABOLIC PANEL
ALT: 23 U/L (ref 0–44)
AST: 31 U/L (ref 15–41)
Albumin: 2.4 g/dL — ABNORMAL LOW (ref 3.5–5.0)
Alkaline Phosphatase: 94 U/L (ref 38–126)
Anion gap: 10 (ref 5–15)
BUN: <5 mg/dL — ABNORMAL LOW (ref 6–20)
CO2: 20 mmol/L — ABNORMAL LOW (ref 22–32)
Calcium: 8 mg/dL — ABNORMAL LOW (ref 8.9–10.3)
Chloride: 108 mmol/L (ref 98–111)
Creatinine, Ser: 0.83 mg/dL (ref 0.44–1.00)
GFR, Estimated: >60 mL/min (ref >60)
Glucose, Bld: 125 mg/dL — ABNORMAL HIGH (ref 70–99)
Potassium: 4.3 mmol/L (ref 3.5–5.1)
Sodium: 138 mmol/L (ref 135–145)
Total Bilirubin: 0.7 mg/dL (ref 0.3–1.2)
Total Protein: 6 g/dL — ABNORMAL LOW (ref 6.5–8.1)

## 2022-08-26 MED ORDER — MISOPROSTOL 200 MCG PO TABS
800.0000 ug | ORAL_TABLET | Freq: Once | ORAL | Status: AC
  Administered 2022-08-26: 800 ug via RECTAL

## 2022-08-26 MED ORDER — COCONUT OIL OIL
1.0000 | TOPICAL_OIL | Status: DC | PRN
  Administered 2022-08-26: 1 via TOPICAL

## 2022-08-26 MED ORDER — OXYCODONE HCL 5 MG PO TABS
10.0000 mg | ORAL_TABLET | ORAL | Status: DC | PRN
  Administered 2022-08-27: 10 mg via ORAL
  Filled 2022-08-26: qty 2

## 2022-08-26 MED ORDER — IBUPROFEN 600 MG PO TABS
600.0000 mg | ORAL_TABLET | Freq: Four times a day (QID) | ORAL | Status: DC
  Administered 2022-08-26 – 2022-08-28 (×10): 600 mg via ORAL
  Filled 2022-08-26 (×10): qty 1

## 2022-08-26 MED ORDER — ACETAMINOPHEN 325 MG PO TABS
650.0000 mg | ORAL_TABLET | ORAL | Status: DC | PRN
  Administered 2022-08-26 – 2022-08-27 (×3): 650 mg via ORAL
  Filled 2022-08-26 (×3): qty 2

## 2022-08-26 MED ORDER — MISOPROSTOL 200 MCG PO TABS
ORAL_TABLET | ORAL | Status: AC
  Filled 2022-08-26: qty 4

## 2022-08-26 MED ORDER — ONDANSETRON HCL 4 MG PO TABS: 4.0000 mg | ORAL_TABLET | ORAL | Status: DC | PRN

## 2022-08-26 MED ORDER — FERROUS SULFATE 325 (65 FE) MG PO TABS
325.0000 mg | ORAL_TABLET | Freq: Two times a day (BID) | ORAL | Status: DC
  Administered 2022-08-26 – 2022-08-28 (×5): 325 mg via ORAL
  Filled 2022-08-26 (×5): qty 1

## 2022-08-26 MED ORDER — ZOLPIDEM TARTRATE 5 MG PO TABS: 5.0000 mg | ORAL_TABLET | Freq: Every evening | ORAL | Status: DC | PRN

## 2022-08-26 MED ORDER — DIBUCAINE (PERIANAL) 1 % EX OINT: 1.0000 | TOPICAL_OINTMENT | CUTANEOUS | Status: DC | PRN

## 2022-08-26 MED ORDER — BENZOCAINE-MENTHOL 20-0.5 % EX AERO
1.0000 | INHALATION_SPRAY | CUTANEOUS | Status: DC | PRN
  Administered 2022-08-28: 1 via TOPICAL
  Filled 2022-08-26 (×2): qty 56

## 2022-08-26 MED ORDER — WITCH HAZEL-GLYCERIN EX PADS: 1.0000 | MEDICATED_PAD | CUTANEOUS | Status: DC | PRN

## 2022-08-26 MED ORDER — SIMETHICONE 80 MG PO CHEW: 80.0000 mg | CHEWABLE_TABLET | ORAL | Status: DC | PRN

## 2022-08-26 MED ORDER — DIPHENHYDRAMINE HCL 25 MG PO CAPS: 25.0000 mg | ORAL_CAPSULE | Freq: Four times a day (QID) | ORAL | Status: DC | PRN

## 2022-08-26 MED ORDER — OXYCODONE HCL 5 MG PO TABS
5.0000 mg | ORAL_TABLET | ORAL | Status: DC | PRN
  Administered 2022-08-27 (×2): 5 mg via ORAL
  Filled 2022-08-26 (×2): qty 1

## 2022-08-26 MED ORDER — ERYTHROMYCIN 5 MG/GM OP OINT
TOPICAL_OINTMENT | OPHTHALMIC | Status: AC
  Filled 2022-08-26: qty 1

## 2022-08-26 MED ORDER — ONDANSETRON HCL 4 MG/2ML IJ SOLN: 4.0000 mg | INTRAMUSCULAR | Status: DC | PRN

## 2022-08-26 MED ORDER — SENNOSIDES-DOCUSATE SODIUM 8.6-50 MG PO TABS
2.0000 | ORAL_TABLET | Freq: Every day | ORAL | Status: DC
  Administered 2022-08-27 – 2022-08-28 (×2): 2 via ORAL
  Filled 2022-08-26 (×2): qty 2

## 2022-08-26 MED ORDER — LABETALOL HCL 200 MG PO TABS
200.0000 mg | ORAL_TABLET | Freq: Two times a day (BID) | ORAL | Status: DC
  Administered 2022-08-26 – 2022-08-28 (×5): 200 mg via ORAL
  Filled 2022-08-26 (×5): qty 1

## 2022-08-26 MED ORDER — MINERAL OIL PO OIL
TOPICAL_OIL | Freq: Every day | ORAL | Status: DC
  Administered 2022-08-26 – 2022-08-28 (×3): 30 mL via ORAL
  Filled 2022-08-26 (×3): qty 30

## 2022-08-26 MED ORDER — PRENATAL MULTIVITAMIN CH
1.0000 | ORAL_TABLET | Freq: Every day | ORAL | Status: DC
  Administered 2022-08-26 – 2022-08-28 (×3): 1 via ORAL
  Filled 2022-08-26 (×3): qty 1

## 2022-08-26 MED ORDER — LACTATED RINGERS IV SOLN: INTRAVENOUS | Status: DC

## 2022-08-26 MED ORDER — INFLUENZA VAC SPLIT QUAD 0.5 ML IM SUSY
0.5000 mL | PREFILLED_SYRINGE | INTRAMUSCULAR | Status: AC
  Administered 2022-08-27: 0.5 mL via INTRAMUSCULAR
  Filled 2022-08-26: qty 0.5

## 2022-08-26 MED ORDER — MAGNESIUM SULFATE 40 GM/1000ML IV SOLN
2.0000 g/h | INTRAVENOUS | Status: AC
  Administered 2022-08-26: 2 g/h via INTRAVENOUS
  Filled 2022-08-26 (×2): qty 1000

## 2022-08-26 NOTE — Anesthesia Postprocedure Evaluation (Cosign Needed)
Anesthesia Post Note  Patient: Brandi Wise  Procedure(s) Performed: AN AD [place]     Patient location during evaluation: OB High Risk Anesthesia Type: Epidural Level of consciousness: awake, oriented and patient cooperative Pain management: pain level controlled Vital Signs Assessment: post-procedure vital signs reviewed and stable Respiratory status: spontaneous breathing Postop Assessment: no headache, able to ambulate and no apparent nausea or vomiting Anesthetic complications: no   No notable events documented.  Last Vitals:  Vitals:   08/26/22 0609 08/26/22 0750  BP: (!) 152/84 (!) 143/80  Pulse: 85 93  Resp: 18 19  Temp: 36.9 C 36.9 C  SpO2: 99% 99%    Last Pain:  Vitals:   08/26/22 0609  TempSrc: Oral  PainSc: 0-No pain   Pain Goal:                   Peggyann Juba

## 2022-08-26 NOTE — Plan of Care (Signed)
Problem: Education: Goal: Knowledge of General Education information will improve Description: Including pain rating scale, medication(s)/side effects and non-pharmacologic comfort measures Outcome: Progressing   Problem: Health Behavior/Discharge Planning: Goal: Ability to manage health-related needs will improve Outcome: Progressing   Problem: Clinical Measurements: Goal: Ability to maintain clinical measurements within normal limits will improve Outcome: Progressing Goal: Will remain free from infection Outcome: Progressing Goal: Diagnostic test results will improve Outcome: Progressing Goal: Respiratory complications will improve Outcome: Progressing Goal: Cardiovascular complication will be avoided Outcome: Progressing   Problem: Activity: Goal: Risk for activity intolerance will decrease Outcome: Progressing   Problem: Nutrition: Goal: Adequate nutrition will be maintained Outcome: Progressing   Problem: Coping: Goal: Level of anxiety will decrease Outcome: Progressing   Problem: Elimination: Goal: Will not experience complications related to bowel motility Outcome: Progressing Goal: Will not experience complications related to urinary retention Outcome: Progressing   Problem: Pain Managment: Goal: General experience of comfort will improve Outcome: Progressing   Problem: Safety: Goal: Ability to remain free from injury will improve Outcome: Progressing   Problem: Skin Integrity: Goal: Risk for impaired skin integrity will decrease Outcome: Progressing   Problem: Education: Goal: Knowledge of disease or condition will improve Outcome: Progressing Goal: Knowledge of the prescribed therapeutic regimen will improve Outcome: Progressing   Problem: Fluid Volume: Goal: Peripheral tissue perfusion will improve Outcome: Progressing   Problem: Clinical Measurements: Goal: Complications related to disease process, condition or treatment will be avoided or  minimized Outcome: Progressing   Problem: Education: Goal: Knowledge of Childbirth will improve Outcome: Progressing Goal: Ability to make informed decisions regarding treatment and plan of care will improve Outcome: Progressing Goal: Ability to state and carry out methods to decrease the pain will improve Outcome: Progressing Goal: Individualized Educational Video(s) Outcome: Progressing   Problem: Coping: Goal: Ability to verbalize concerns and feelings about labor and delivery will improve Outcome: Progressing   Problem: Life Cycle: Goal: Ability to make normal progression through stages of labor will improve Outcome: Progressing Goal: Ability to effectively push during vaginal delivery will improve Outcome: Progressing   Problem: Role Relationship: Goal: Will demonstrate positive interactions with the child Outcome: Progressing   Problem: Safety: Goal: Risk of complications during labor and delivery will decrease Outcome: Progressing   Problem: Pain Management: Goal: Relief or control of pain from uterine contractions will improve Outcome: Progressing   Problem: Education: Goal: Knowledge of Childbirth will improve Outcome: Progressing Goal: Ability to make informed decisions regarding treatment and plan of care will improve Outcome: Progressing Goal: Ability to state and carry out methods to decrease the pain will improve Outcome: Progressing Goal: Individualized Educational Video(s) Outcome: Progressing   Problem: Coping: Goal: Ability to verbalize concerns and feelings about labor and delivery will improve Outcome: Progressing   Problem: Life Cycle: Goal: Ability to make normal progression through stages of labor will improve Outcome: Progressing Goal: Ability to effectively push during vaginal delivery will improve Outcome: Progressing   Problem: Role Relationship: Goal: Will demonstrate positive interactions with the child Outcome: Progressing    Problem: Safety: Goal: Risk of complications during labor and delivery will decrease Outcome: Progressing   Problem: Pain Management: Goal: Relief or control of pain from uterine contractions will improve Outcome: Progressing   Problem: Education: Goal: Knowledge of condition will improve Outcome: Progressing Goal: Individualized Educational Video(s) Outcome: Progressing Goal: Individualized Newborn Educational Video(s) Outcome: Progressing   Problem: Activity: Goal: Will verbalize the importance of balancing activity with adequate rest periods Outcome: Progressing Goal: Ability to  tolerate increased activity will improve Outcome: Progressing   Problem: Coping: Goal: Ability to identify and utilize available resources and services will improve Outcome: Progressing   Problem: Life Cycle: Goal: Chance of risk for complications during the postpartum period will decrease Outcome: Progressing   Problem: Role Relationship: Goal: Ability to demonstrate positive interaction with newborn will improve Outcome: Progressing   Problem: Skin Integrity: Goal: Demonstration of wound healing without infection will improve Outcome: Progressing

## 2022-08-26 NOTE — Progress Notes (Signed)
S: c/o nausea  O: VS BP 145/90 P87 RR 16 Pitocin 28 miu VE fully (+) 2 station small caput Tracing baseline 130 (+) accel 155 Ctx q 2 mins  Foley 75 ml bloody urine  IMP: Complete  Severe gestational HTN on Magnesium sulfate Labetalol and hydralazine Obesity affecting pregnancy P) d/c foley start pushing. Anticipate delivery

## 2022-08-26 NOTE — Progress Notes (Signed)
PPD{NUMBERS 1-5:20334} SVD:   S:  Pt reports feeling ***/ Tolerating po/ Voiding without problems/ No n/v/ Bleeding is {Description; bleeding vaginal:11356}/ Pain controlled with{treatments; pain control med:13496}  Newborn info ***   O:  A & O x 3 ***/ VS: Blood pressure (!) 143/80, pulse 93, temperature 98.4 F (36.9 C), resp. rate 17, height '5\' 5"'$  (1.651 m), weight 130.4 kg, last menstrual period 09/24/2021, SpO2 99 %, unknown if currently breastfeeding.  LABS:  Results for orders placed or performed during the hospital encounter of 08/24/22 (from the past 24 hour(s))  CBC     Status: Abnormal   Collection Time: 08/25/22  7:19 PM  Result Value Ref Range   WBC 15.6 (H) 4.0 - 10.5 K/uL   RBC 4.05 3.87 - 5.11 MIL/uL   Hemoglobin 11.5 (L) 12.0 - 15.0 g/dL   HCT 33.7 (L) 36.0 - 46.0 %   MCV 83.2 80.0 - 100.0 fL   MCH 28.4 26.0 - 34.0 pg   MCHC 34.1 30.0 - 36.0 g/dL   RDW 14.6 11.5 - 15.5 %   Platelets 349 150 - 400 K/uL   nRBC 0.0 0.0 - 0.2 %  CBC with Differential/Platelet     Status: Abnormal   Collection Time: 08/26/22  4:06 AM  Result Value Ref Range   WBC 20.4 (H) 4.0 - 10.5 K/uL   RBC 4.25 3.87 - 5.11 MIL/uL   Hemoglobin 12.1 12.0 - 15.0 g/dL   HCT 36.7 36.0 - 46.0 %   MCV 86.4 80.0 - 100.0 fL   MCH 28.5 26.0 - 34.0 pg   MCHC 33.0 30.0 - 36.0 g/dL   RDW 14.9 11.5 - 15.5 %   Platelets 396 150 - 400 K/uL   nRBC 0.0 0.0 - 0.2 %   Neutrophils Relative % 89 %   Neutro Abs 18.1 (H) 1.7 - 7.7 K/uL   Lymphocytes Relative 5 %   Lymphs Abs 1.1 0.7 - 4.0 K/uL   Monocytes Relative 5 %   Monocytes Absolute 1.0 0.1 - 1.0 K/uL   Eosinophils Relative 0 %   Eosinophils Absolute 0.0 0.0 - 0.5 K/uL   Basophils Relative 0 %   Basophils Absolute 0.1 0.0 - 0.1 K/uL   Immature Granulocytes 1 %   Abs Immature Granulocytes 0.12 (H) 0.00 - 0.07 K/uL  Comprehensive metabolic panel     Status: Abnormal   Collection Time: 08/26/22  5:12 AM  Result Value Ref Range   Sodium 138 135 - 145  mmol/L   Potassium 4.3 3.5 - 5.1 mmol/L   Chloride 108 98 - 111 mmol/L   CO2 20 (L) 22 - 32 mmol/L   Glucose, Bld 125 (H) 70 - 99 mg/dL   BUN <5 (L) 6 - 20 mg/dL   Creatinine, Ser 0.83 0.44 - 1.00 mg/dL   Calcium 8.0 (L) 8.9 - 10.3 mg/dL   Total Protein 6.0 (L) 6.5 - 8.1 g/dL   Albumin 2.4 (L) 3.5 - 5.0 g/dL   AST 31 15 - 41 U/L   ALT 23 0 - 44 U/L   Alkaline Phosphatase 94 38 - 126 U/L   Total Bilirubin 0.7 0.3 - 1.2 mg/dL   GFR, Estimated >60 >60 mL/min   Anion gap 10 5 - 15    I&O: I/O last 3 completed shifts: In: 8454.9 [P.O.:2306; I.V.:5707.3; Other:441.6] Out: 9718 [Urine:8870; Blood:848]   Total I/O In: 1981.9 [P.O.:1080; I.V.:901.9] Out: 650 [Urine:650]  Lungs: {Exam; lungs:5033}  Heart: {Exam; heart:5510}  Abdomen: {  PE ABDOMEN POSTPARTUM OBGYN:313106}  Perineum: {exam; perineum:10172}  Lochia: ***  Extremities:{pe extremities BU:037096}    A/P: PPD # ***/ [id number]  Doing well  Continue routine post partum orders  ***

## 2022-08-26 NOTE — Lactation Note (Signed)
This note was copied from a baby's chart. Lactation Consultation Note  Patient Name: Brandi Wise [id number] Date: 08/26/2022 Reason for consult: L&D Initial assessment;Early term 37-38.6wks Age:33 hours   Initial L&D Consult:  Visited with family < 1 hour after delivery Assisted to latch after a few attempts.  Reassured parents that lactation assistance will be available on the M/B unit.  Support person present.   Maternal Data    Feeding Mother's Current Feeding Choice: Breast Milk  LATCH Score Latch: Repeated attempts needed to sustain latch, nipple held in mouth throughout feeding, stimulation needed to elicit sucking reflex.  Audible Swallowing: None  Type of Nipple: Everted at rest and after stimulation  Comfort (Breast/Nipple): Soft / non-tender  Hold (Positioning): Assistance needed to correctly position infant at breast and maintain latch.  LATCH Score: 6   Lactation Tools Discussed/Used    Interventions Interventions: Skin to skin;Assisted with latch  Discharge    Consult Status Consult Status: Follow-up from L&D    [name] 08/26/2022, 3:58 AM

## 2022-08-26 NOTE — Lactation Note (Signed)
This note was copied from a baby's chart. Lactation Consultation Note  Patient Name: Girl Nalleli Largent [id number]'[id number] Date: 08/26/2022 Reason for consult: Initial assessment;Early term 37-38.6wks Age:33 hours   P1: Early term infant at 38+0 weeks Feeding preference: Breast/formula ' "[place]" was swaddled and asleep when I arrived.  Spoke with birth parent regarding feeding goals.  She is hoping to exclusively breast feed, however, is providing supplementation as needed.    Taught hand expression; no drops noted at this time.  Encouraged lots of STS, breast massage and hand expression.  Suggested she attempt to breast feed with cues or at least every three hours.  Provided supplementation volume guidelines and reviewed with parents.  Offered to observe pumping and to review pump set up and cleaning; birth parent receptive.  Changed flange size to a #21 for better fit and comfort.  Discussed milk storage times and encouraged finger feeding/spoon feeding any expressed drops to "[place]."  Suggested she call her RN/LC for assistance as needed.  Feeding plan for the remainder of the day/night discussed with parents.  Family appreciative and will call as needed.   Maternal Data Has patient been taught Hand Expression?: Yes Does the patient have breastfeeding experience prior to this delivery?: No  Feeding Mother's Current Feeding Choice: Breast Milk and Formula Nipple Type: Slow - flow  LATCH Score                    Lactation Tools Discussed/Used Tools: Pump;Flanges Flange Size: 21 Breast pump type: Double-Electric Breast Pump;Manual Pump Education: Setup, frequency, and cleaning;Milk Storage Reason for Pumping: Breast stimulation for supplementation Pumping frequency: Every three hours Pumped volume: 0 mL  Interventions Interventions: Breast feeding basics reviewed;Hand express;Hand pump;DEBP;Education;LC Services brochure  Discharge Pump: Personal (Spectra)  Consult  Status Consult Status: Follow-up Date: 08/27/22 Follow-up type: In-patient    Kimie Pidcock R Usha Slager 08/26/2022, 11:33 AM

## 2022-08-27 LAB — CBC
HCT: 27 % — ABNORMAL LOW (ref 36.0–46.0)
Hemoglobin: 9 g/dL — ABNORMAL LOW (ref 12.0–15.0)
MCH: 28.4 pg (ref 26.0–34.0)
MCHC: 33.3 g/dL (ref 30.0–36.0)
MCV: 85.2 fL (ref 80.0–100.0)
Platelets: 299 10*3/uL (ref 150–400)
RBC: 3.17 MIL/uL — ABNORMAL LOW (ref 3.87–5.11)
RDW: 15 % (ref 11.5–15.5)
WBC: 18.3 10*3/uL — ABNORMAL HIGH (ref 4.0–10.5)
nRBC: 0 % (ref 0.0–0.2)

## 2022-08-27 LAB — URIC ACID: Uric Acid, Serum: 5.4 mg/dL (ref 2.5–7.1)

## 2022-08-27 MED ORDER — SODIUM CHLORIDE 0.9% FLUSH
3.0000 mL | Freq: Two times a day (BID) | INTRAVENOUS | Status: DC
  Administered 2022-08-27 (×2): 3 mL via INTRAVENOUS

## 2022-08-27 MED ORDER — SODIUM CHLORIDE 0.9% FLUSH: 3.0000 mL | INTRAVENOUS | Status: DC | PRN

## 2022-08-27 NOTE — Lactation Note (Signed)
This note was copied from a baby's chart. Lactation Consultation Note  Patient Name: Brandi Wise [id number] Date: 08/27/2022 Reason for consult: Follow-up assessment Age:33 hours  LC in to room for follow up. Lactating parent (LP) is currently pumping. LP reports no milk collected yet after pumping session. Denies discomfort/pain with pumping.   Infant is with support person. Per LP, infant has been feeding well and has good output. Reviewed feeding volume per age.  LC provided a pumping top.  Feeding plan:  1-Feeding on demand 8-12 x in 24h 2-Pump using 21-mm flange 8-12 times in 24h period  3-Encouraged maternal rest, hydration and food intake.   Contact LC as needed for feeds/support/concerns/questions. All questions answered at this time.    Feeding Mother's Current Feeding Choice: Breast Milk and Formula  Lactation Tools Discussed/Used Flange Size: 21 Breast pump type: Double-Electric Breast Pump;Manual Reason for Pumping: stimulation and supplementation Pumping frequency: after feedings Pumped volume: 0 mL  Interventions Interventions: Breast massage;DEBP;Education  Discharge Pump: DEBP;Manual  Consult Status Consult Status: Follow-up Date: 08/28/22 Follow-up type: In-patient    Esteban Kobashigawa A Higuera Ancidey 08/27/2022, 1:45 PM

## 2022-08-27 NOTE — Progress Notes (Signed)
PPD1 SVD:   S:  Pt reports feeling well/ Tolerating po/ Voiding without problems/ No n/v/ Bleeding is light/ Pain controlled withprescription NSAID's including ibuprofen (Motrin)  Newborn info live female BR, BF   O:  A & O x 3 / VS: Blood pressure 131/78, pulse 80, temperature 98.5 F (36.9 C), temperature source Oral, resp. rate 18, height '5\' 5"'$  (1.651 m), weight 130.4 kg, last menstrual period 09/24/2021, SpO2 98 %, unknown if currently breastfeeding. Patient Vitals for the past 24 hrs:  BP Temp Temp src Pulse Resp SpO2  08/27/22 0600 -- -- -- -- 18 --  08/27/22 0500 -- -- -- -- 18 --  08/27/22 0440 -- -- -- -- 19 --  08/27/22 0311 131/78 98.5 F (36.9 C) Oral 80 18 98 %  08/27/22 0200 -- -- -- -- 18 --  08/27/22 0100 -- -- -- -- 19 --  08/26/22 2353 134/77 98.4 F (36.9 C) Oral 87 20 100 %  08/26/22 2200 -- -- -- -- 18 --  08/26/22 2100 -- -- -- -- 18 --  08/26/22 2013 (!) 146/81 98.4 F (36.9 C) Oral 83 20 --  08/26/22 1841 -- -- -- -- 19 --  08/26/22 1800 -- -- -- -- 19 --  08/26/22 1712 135/81 98.4 F (36.9 C) Oral 92 18 96 %  08/26/22 1500 -- -- -- -- 20 --  08/26/22 1400 -- -- -- -- (!) 21 --  08/26/22 1328 (!) 151/91 98.1 F (36.7 C) Oral 85 19 98 %  08/26/22 1300 -- -- -- -- 19 --  08/26/22 1200 -- -- -- -- 17 --  08/26/22 1100 -- -- -- -- 19 --  08/26/22 1000 -- -- -- -- 20 --  08/26/22 0900 -- -- -- -- 17 --     LABS:  Results for orders placed or performed during the hospital encounter of 08/24/22 (from the past 24 hour(s))  CBC     Status: Abnormal   Collection Time: 08/27/22  4:01 AM  Result Value Ref Range   WBC 18.3 (H) 4.0 - 10.5 K/uL   RBC 3.17 (L) 3.87 - 5.11 MIL/uL   Hemoglobin 9.0 (L) 12.0 - 15.0 g/dL   HCT 27.0 (L) 36.0 - 46.0 %   MCV 85.2 80.0 - 100.0 fL   MCH 28.4 26.0 - 34.0 pg   MCHC 33.3 30.0 - 36.0 g/dL   RDW 15.0 11.5 - 15.5 %   Platelets 299 150 - 400 K/uL   nRBC 0.0 0.0 - 0.2 %  Uric acid     Status: None   Collection Time:  08/27/22  4:01 AM  Result Value Ref Range   Uric Acid, Serum 5.4 2.5 - 7.1 mg/dL    I&O: I/O last 3 completed shifts: In: 7894.6 [P.O.:4318; I.V.:3485; Other:91.6] Out: 0160 [Urine:3095; Blood:848]   No intake/output data recorded.  Lungs: chest clear, no wheezing, rales, normal symmetric air entry  Heart: regular rate and rhythm, S1, S2 normal, no murmur, click, rub or gallop  Abdomen: uterus firm at umb  Perineum: healing with good reapproximation  Lochia: light  Extremities:no redness or tenderness in the calves or thighs, edema tr+    A/P: PPD # 1/ [id number] Gestational HTN s/p Mag sulfate on labetalol and hydralazine  Doing well  Continue routine post partum orders  Anticipate d/c in am

## 2022-08-28 MED ORDER — HYDROCHLOROTHIAZIDE 25 MG PO TABS: 25.0000 mg | ORAL_TABLET | Freq: Every day | ORAL | 0 refills | Status: DC

## 2022-08-28 MED ORDER — MINERAL OIL PO OIL: TOPICAL_OIL | ORAL | 0 refills | Status: AC

## 2022-08-28 MED ORDER — IBUPROFEN 600 MG PO TABS: 600.0000 mg | ORAL_TABLET | Freq: Four times a day (QID) | ORAL | 11 refills | Status: AC

## 2022-08-28 MED ORDER — FERROUS SULFATE 324 MG PO TBEC: 324.0000 mg | DELAYED_RELEASE_TABLET | Freq: Two times a day (BID) | ORAL | 11 refills | Status: AC

## 2022-08-28 MED ORDER — HYDRALAZINE HCL 50 MG PO TABS: 50.0000 mg | ORAL_TABLET | Freq: Three times a day (TID) | ORAL | 0 refills | Status: DC

## 2022-08-28 MED ORDER — LABETALOL HCL 200 MG PO TABS: 200.0000 mg | ORAL_TABLET | Freq: Two times a day (BID) | ORAL | 1 refills | Status: DC

## 2022-08-28 MED ORDER — HYDROCHLOROTHIAZIDE 25 MG PO TABS
25.0000 mg | ORAL_TABLET | Freq: Every day | ORAL | Status: DC
  Administered 2022-08-28: 25 mg via ORAL
  Filled 2022-08-28: qty 1

## 2022-08-28 NOTE — Discharge Summary (Signed)
Postpartum Discharge Summary  Date of Service updated***     Patient Name: Brandi Wise DOB: 01-05-1991 MRN: [id number]  Date of admission: 08/24/2022 Delivery date:08/26/2022  Delivering provider: Venessa Wickham  Date of discharge: 08/28/2022  Admitting diagnosis: Chronic hypertension with superimposed preeclampsia [O11.9] Vacuum-assisted vaginal delivery [Z37.9] Intrauterine pregnancy: [redacted]w[redacted]d    Secondary diagnosis:  Principal Problem:   Chronic hypertension with superimposed preeclampsia Active Problems:   Vacuum-assisted vaginal delivery  Additional problems: ***    Discharge diagnosis: {DX.:23714}                                              Post partum procedures:{Postpartum procedures:23558} Augmentation: {{SLHTDSKAJGOT:15726}Complications: {OB Labor/Delivery Complications:20784}  Hospital course: {Courses:23701}  Magnesium Sulfate received: {Mag received:30440022} BMZ received: {BMZ received:30440023} Rhophylac:{Rhophylac received:30440032} MMR:{MMR:30440033} T-DaP:{Tdap:23962} Flu: {{OMB:55974}Transfusion:{Transfusion received:30440034}  Physical exam  Vitals:   08/28/22 0431 08/28/22 0518 08/28/22 0552 08/28/22 0841  BP: (!) 147/89  (!) 147/96 (!) 142/79  Pulse: 80  88 92  Resp:  16  20  Temp:  98 F (36.7 C)  97.9 F (36.6 C)  TempSrc:  Oral  Oral  SpO2: 99%   98%  Weight:      Height:       General: {Exam; general:21111117} Lochia: {Desc; appropriate/inappropriate:30686::"appropriate"} Uterine Fundus: {Desc; firm/soft:30687} Incision: {Exam; incision:21111123} DVT Evaluation: {Exam; dvt:2111122} Labs: Lab Results  Component Value Date   WBC 18.3 (H) 08/27/2022   HGB 9.0 (L) 08/27/2022   HCT 27.0 (L) 08/27/2022   MCV 85.2 08/27/2022   PLT 299 08/27/2022      Latest Ref Rng & Units 08/26/2022    5:12 AM  CMP  Glucose 70 - 99 mg/dL 125   BUN 6 - 20 mg/dL <5   Creatinine 0.44 - 1.00 mg/dL 0.83   Sodium 135 - 145 mmol/L 138    Potassium 3.5 - 5.1 mmol/L 4.3   Chloride 98 - 111 mmol/L 108   CO2 22 - 32 mmol/L 20   Calcium 8.9 - 10.3 mg/dL 8.0   Total Protein 6.5 - 8.1 g/dL 6.0   Total Bilirubin 0.3 - 1.2 mg/dL 0.7   Alkaline Phos 38 - 126 U/L 94   AST 15 - 41 U/L 31   ALT 0 - 44 U/L 23    Edinburgh Score:    08/26/2022    4:19 PM  Edinburgh Postnatal Depression Scale Screening Tool  I have been able to laugh and see the funny side of things. 0  I have looked forward with enjoyment to things. 0  I have blamed myself unnecessarily when things went wrong. 0  I have been anxious or worried for no good reason. 0  I have felt scared or panicky for no good reason. 0  Things have been getting on top of me. 0  I have been so unhappy that I have had difficulty sleeping. 0  I have felt sad or miserable. 0  I have been so unhappy that I have been crying. 0  The thought of harming myself has occurred to me. 0  Edinburgh Postnatal Depression Scale Total 0      After visit meds:  Allergies as of 08/28/2022   No Known Allergies      Medication List     TAKE these medications    ferrous sulfate 324 MG Tbec Take  1 tablet (324 mg total) by mouth 2 (two) times daily. What changed: when to take this   folic acid 1 MG tablet Commonly known as: FOLVITE Take 1 tablet (1 mg total) by mouth daily.   hydrALAZINE 50 MG tablet Commonly known as: APRESOLINE Take 1 tablet (50 mg total) by mouth every 8 (eight) hours.   hydrochlorothiazide 25 MG tablet Commonly known as: HYDRODIURIL Take 1 tablet (25 mg total) by mouth daily for 5 days.   ibuprofen 600 MG tablet Commonly known as: ADVIL Take 1 tablet (600 mg total) by mouth every 6 (six) hours.   labetalol 200 MG tablet Commonly known as: NORMODYNE Take 1 tablet (200 mg total) by mouth 2 (two) times daily.   levothyroxine 75 MCG tablet Commonly known as: SYNTHROID Take 1 tablet (75 mcg total) by mouth daily.   mineral oil liquid 30 ml    multivitamin-prenatal 27-0.8 MG Tabs tablet Take 1 tablet by mouth daily at 12 noon.         Discharge home in stable condition Infant Feeding: {Baby feeding:23562} Infant Disposition:{CHL IP OB HOME WITH XAFHSV:07460} Discharge instruction: per After Visit Summary and Postpartum booklet. Activity: Advance as tolerated. Pelvic rest for 6 weeks.  Diet: {OB diet:21111121} Anticipated Birth Control: {Birth Control:23956} Postpartum Appointment:{Outpatient follow up:23559} Additional Postpartum F/U: {PP Procedure:23957} Future Appointments:No future appointments. Follow up Visit:  Follow-up Information     Servando Salina, MD Follow up in 1 week(s).   Specialty: Obstetrics and Gynecology Contact information: [address] [place] [address] [phone]                     08/28/2022 Marvene Staff, MD

## 2022-08-28 NOTE — Progress Notes (Signed)
PPD{NUMBERS 1-5:20334} SVD:   S:  Pt reports feeling ***/ Tolerating po/ Voiding without problems/ No n/v/ Bleeding is {Description; bleeding vaginal:11356}/ Pain controlled with{treatments; pain control med:13496}  Newborn info ***   O:  A & O x 3 ***/ VS: Blood pressure (!) 142/79, pulse 92, temperature 97.9 F (36.6 C), temperature source Oral, resp. rate 20, height '5\' 5"'$  (1.651 m), weight 130.4 kg, last menstrual period 09/24/2021, SpO2 98 %, unknown if currently breastfeeding.  LABS: No results found for this or any previous visit (from the past 24 hour(s)).  I&O: I/O last 3 completed shifts: In: 1860 [P.O.:1860] Out: 600 [Urine:600]   No intake/output data recorded.  Lungs: {Exam; lungs:5033}  Heart: {Exam; heart:5510}  Abdomen: {PE ABDOMEN POSTPARTUM OBGYN:313106}  Perineum: {exam; perineum:10172}  Lochia: ***  Extremities:{pe extremities TM:226333}    A/P: PPD # ***/ [id number]  Doing well  Continue routine post partum orders  ***

## 2022-08-28 NOTE — Plan of Care (Signed)
Patient given discharge instructions with patient: Pre-eclampsia S/S, PP care, when to call MD/office, infant care, F/U appointment with doctor. Patient verbalizes understanding.

## 2022-08-28 NOTE — Discharge Instructions (Signed)
Call if temperature greater than equal to 100.4, nothing per vagina for 4-6 weeks or severe nausea vomiting, increased incisional pain , drainage or redness in the incision site, no straining with bowel movements, showers no bath  Miralax daily. No straining with bowel movement. Daily mineral oil 30 ml. Stool softener twice a day

## 2022-08-28 NOTE — Lactation Note (Signed)
This note was copied from a baby's chart. Lactation Consultation Note  Patient Name: Brandi Wise [id number] Date: 08/28/2022 Reason for consult: Follow-up assessment;Early term 37-38.6wks Age:33 hours  LC in to room, parents are expecting discharge. Infant is skin to skin upon arrival. Birthing parent report pumping but collecting nothing at this time. Parents are pace bottle-feeding and have volume guidelines for formula feeding. Discussed normal behavior and patterns after 24h, voids and stools as signs good intake, pumping, clusterfeeding, skin to skin. Talked about milk coming into volume and managing engorgement.  Provided a pumping top. Plan: 1-Feeding on demand or 8-12 times in 24h period. 2-Hand express/pump as needed for supplementation 3-Encouraged birthing parent rest, hydration and food intake.   Contact LC as needed for feeds/support/concerns/questions. All questions answered at this time. Reviewed [place] brochure.     Feeding Mother's Current Feeding Choice: Breast Milk and Formula  Lactation Tools Discussed/Used Breast pump type: Double-Electric Breast Pump;Manual Reason for Pumping: stimulation and supplementation Pumping frequency: at least every 8h  Interventions Interventions: Breast feeding basics reviewed;Pace feeding;Education;DEBP;Expressed milk  Discharge Discharge Education: Engorgement and breast care;Warning signs for feeding baby Pump: DEBP;Manual;Personal  Consult Status Consult Status: Complete Date: 08/28/22 Follow-up type: Call as needed    [place] 08/28/2022, 12:08 PM

## 2022-09-05 ENCOUNTER — Encounter (HOSPITAL_COMMUNITY): Payer: Self-pay | Admitting: Obstetrics and Gynecology

## 2022-09-05 ENCOUNTER — Other Ambulatory Visit: Payer: Self-pay

## 2022-09-05 ENCOUNTER — Telehealth (HOSPITAL_COMMUNITY): Payer: Self-pay

## 2022-09-05 ENCOUNTER — Inpatient Hospital Stay (HOSPITAL_COMMUNITY)
Admission: AD | Admit: 2022-09-05 | Discharge: 2022-09-06 | DRG: 776 | Disposition: A | Discharge status: Home / Self Care | Payer: 59 | Attending: Obstetrics and Gynecology | Admitting: Obstetrics and Gynecology

## 2022-09-05 DIAGNOSIS — E039 Hypothyroidism, unspecified: Secondary | ICD-10-CM | POA: Diagnosis not present

## 2022-09-05 DIAGNOSIS — O1495 Unspecified pre-eclampsia, complicating the puerperium: Secondary | ICD-10-CM | POA: Diagnosis not present

## 2022-09-05 DIAGNOSIS — O99285 Endocrine, nutritional and metabolic diseases complicating the puerperium: Secondary | ICD-10-CM | POA: Diagnosis not present

## 2022-09-05 DIAGNOSIS — Z79899 Other long term (current) drug therapy: Secondary | ICD-10-CM

## 2022-09-05 DIAGNOSIS — O1415 Severe pre-eclampsia, complicating the puerperium: Secondary | ICD-10-CM | POA: Diagnosis not present

## 2022-09-05 DIAGNOSIS — I10 Essential (primary) hypertension: Secondary | ICD-10-CM | POA: Insufficient documentation

## 2022-09-05 HISTORY — DX: Unspecified pre-eclampsia, complicating the puerperium: O14.95

## 2022-09-05 HISTORY — DX: Gestational (pregnancy-induced) hypertension without significant proteinuria, unspecified trimester: O13.9

## 2022-09-05 LAB — COMPREHENSIVE METABOLIC PANEL
ALT: 23 U/L (ref 0–44)
AST: 24 U/L (ref 15–41)
Albumin: 3.3 g/dL — ABNORMAL LOW (ref 3.5–5.0)
Alkaline Phosphatase: 85 U/L (ref 38–126)
Anion gap: 11 (ref 5–15)
BUN: 11 mg/dL (ref 6–20)
CO2: 18 mmol/L — ABNORMAL LOW (ref 22–32)
Calcium: 8.5 mg/dL — ABNORMAL LOW (ref 8.9–10.3)
Chloride: 108 mmol/L (ref 98–111)
Creatinine, Ser: 0.81 mg/dL (ref 0.44–1.00)
GFR, Estimated: >60 mL/min (ref >60)
Glucose, Bld: 84 mg/dL (ref 70–99)
Potassium: 3.7 mmol/L (ref 3.5–5.1)
Sodium: 137 mmol/L (ref 135–145)
Total Bilirubin: 0.2 mg/dL — ABNORMAL LOW (ref 0.3–1.2)
Total Protein: 7 g/dL (ref 6.5–8.1)

## 2022-09-05 LAB — CBC
HCT: 35.2 % — ABNORMAL LOW (ref 36.0–46.0)
Hemoglobin: 11.3 g/dL — ABNORMAL LOW (ref 12.0–15.0)
MCH: 28.2 pg (ref 26.0–34.0)
MCHC: 32.1 g/dL (ref 30.0–36.0)
MCV: 87.8 fL (ref 80.0–100.0)
Platelets: 645 10*3/uL — ABNORMAL HIGH (ref 150–400)
RBC: 4.01 MIL/uL (ref 3.87–5.11)
RDW: 14.4 % (ref 11.5–15.5)
WBC: 12.3 10*3/uL — ABNORMAL HIGH (ref 4.0–10.5)
nRBC: 0 % (ref 0.0–0.2)

## 2022-09-05 LAB — PROTEIN / CREATININE RATIO, URINE
Creatinine, Urine: 38 mg/dL
Total Protein, Urine: <6 mg/dL

## 2022-09-05 MED ORDER — MAGNESIUM SULFATE BOLUS VIA INFUSION
4.0000 g | Freq: Once | INTRAVENOUS | Status: AC
  Administered 2022-09-05: 4 g via INTRAVENOUS
  Filled 2022-09-05: qty 1000

## 2022-09-05 MED ORDER — HYDRALAZINE HCL 50 MG PO TABS
50.0000 mg | ORAL_TABLET | Freq: Three times a day (TID) | ORAL | Status: DC
  Filled 2022-09-05: qty 1

## 2022-09-05 MED ORDER — LABETALOL HCL 100 MG PO TABS: 400.0000 mg | ORAL_TABLET | Freq: Two times a day (BID) | ORAL | Status: DC

## 2022-09-05 MED ORDER — LABETALOL HCL 5 MG/ML IV SOLN: 80.0000 mg | INTRAVENOUS | Status: DC | PRN

## 2022-09-05 MED ORDER — LACTATED RINGERS IV SOLN: INTRAVENOUS | Status: DC

## 2022-09-05 MED ORDER — HYDRALAZINE HCL 50 MG PO TABS
50.0000 mg | ORAL_TABLET | Freq: Three times a day (TID) | ORAL | Status: DC
  Administered 2022-09-05 – 2022-09-06 (×3): 50 mg via ORAL
  Filled 2022-09-05 (×4): qty 1

## 2022-09-05 MED ORDER — HYDRALAZINE HCL 20 MG/ML IJ SOLN: 10.0000 mg | INTRAMUSCULAR | Status: DC | PRN

## 2022-09-05 MED ORDER — LABETALOL HCL 100 MG PO TABS: 300.0000 mg | ORAL_TABLET | Freq: Two times a day (BID) | ORAL | Status: DC

## 2022-09-05 MED ORDER — HYDRALAZINE HCL 50 MG PO TABS
50.0000 mg | ORAL_TABLET | Freq: Four times a day (QID) | ORAL | Status: DC
  Filled 2022-09-05 (×3): qty 1

## 2022-09-05 MED ORDER — LABETALOL HCL 5 MG/ML IV SOLN
20.0000 mg | INTRAVENOUS | Status: DC | PRN
  Filled 2022-09-05: qty 4

## 2022-09-05 MED ORDER — LABETALOL HCL 200 MG PO TABS
400.0000 mg | ORAL_TABLET | Freq: Two times a day (BID) | ORAL | Status: DC
  Administered 2022-09-05 – 2022-09-06 (×2): 400 mg via ORAL
  Filled 2022-09-05 (×2): qty 2

## 2022-09-05 MED ORDER — LABETALOL HCL 5 MG/ML IV SOLN
INTRAVENOUS | Status: AC
  Administered 2022-09-05: 20 mg via INTRAVENOUS
  Filled 2022-09-05: qty 4

## 2022-09-05 MED ORDER — MAGNESIUM SULFATE 40 GM/1000ML IV SOLN
2.0000 g/h | INTRAVENOUS | Status: DC
  Administered 2022-09-05 – 2022-09-06 (×2): 2 g/h via INTRAVENOUS
  Filled 2022-09-05 (×2): qty 1000

## 2022-09-05 MED ORDER — LABETALOL HCL 5 MG/ML IV SOLN: 40.0000 mg | INTRAVENOUS | Status: DC | PRN

## 2022-09-05 NOTE — Consult Note (Signed)
Cardiology Consultation   Patient ID: Brandi Wise MRN: [id number]; DOB: [birth year]  Admit date: 09/05/2022 Date of Consult: 09/05/2022  PCP:  Hoyt Koch, MD   [place] Providers Cardiologist:  Patient Profile:   Brandi Wise is a 31 y.o. female with a hx of  preeclampsia who is being seen 09/05/2022 for the evaluation of HTN  at the request of Dr Garwin Brothers.  History of Present Illness:   Brandi Wise is a 31 yo G3P1011 who was recently induced on 9.27/23 for preeclampsia      ==Given MgSO4, Treated with po labetalol and hydralazine   Discharged on 08/28/22 on  hydralazine 50 tid and HCTZ 25 for 5 days and labetalol 200 bid      Since d/c the pt has followed her BP closely   She says she can tell when it is getting up    Will get fullness in head.    BPs:  147/94, 150/97, 131/94, 162/102, 167/105, 156/98.  This morning it was 121/89 then up to 171/115, 184/127      She denies CP   Breathing is OK   Edema in legs gone wit hdiuretic No palpitations   The pt says she did not have HTN with other pregnancies  The pt is not breast feeding her baby     Past Medical History:  Diagnosis Date   Knee pain    Medical history non-contributory    Pre-diabetes    Pregnancy induced hypertension     Past Surgical History:  Procedure Laterality Date   POLYPECTOMY     THYROIDECTOMY Left 05/19/2021   Procedure: THYROID LOBECTOMY;  Surgeon: Melida Quitter, MD;  Location: [facility];  Service: ENT;  Laterality: Left;     Home Medications:  Prior to Admission medications   Medication Sig Start Date End Date Taking? Authorizing Provider  ferrous sulfate 324 MG TBEC Take 1 tablet (324 mg total) by mouth 2 (two) times daily. 08/28/22  Yes Servando Salina, MD  folic acid (FOLVITE) 1 MG tablet Take 1 tablet (1 mg total) by mouth daily. 01/14/22  Yes Bertis Ruddy V, NP  hydrALAZINE (APRESOLINE) 50 MG tablet Take 1 tablet (50 mg total) by mouth every 8 (eight) hours. 08/28/22  09/27/22 Yes Servando Salina, MD  ibuprofen (ADVIL) 600 MG tablet Take 1 tablet (600 mg total) by mouth every 6 (six) hours. 08/28/22  Yes Servando Salina, MD  labetalol (NORMODYNE) 200 MG tablet Take 1 tablet (200 mg total) by mouth 2 (two) times daily. 08/28/22  Yes Servando Salina, MD  levothyroxine (SYNTHROID) 75 MCG tablet Take 1 tablet (75 mcg total) by mouth daily. 04/27/22  Yes Hoyt Koch, MD  Prenatal Vit-Fe Fumarate-FA (MULTIVITAMIN-PRENATAL) 27-0.8 MG TABS tablet Take 1 tablet by mouth daily at 12 noon.   Yes [provider]  hydrochlorothiazide (HYDRODIURIL) 25 MG tablet Take 1 tablet (25 mg total) by mouth daily for 5 days. 08/28/22 09/02/22  Servando Salina, MD  mineral oil liquid 30 ml 08/28/22   Servando Salina, MD    Inpatient Medications: Scheduled Meds:  Continuous Infusions:  PRN Meds:   Allergies:   No Known Allergies  Social History:   Social History   Socioeconomic History   Marital status: Single    Spouse name: Not on file   Number of children: Not on file   Years of education: Not on file   Highest education level: Not on file  Occupational History   Occupation: behavioral health/clinical social worker  Tobacco Use   Smoking status: Never   Smokeless tobacco: Never  Vaping Use   Vaping Use: Never used  Substance and Sexual Activity   Alcohol use: Not Currently    Comment: socially   Drug use: Never   Sexual activity: Not Currently  Other Topics Concern   Not on file  Social History Narrative   Right handed   Lives alone third fl apartment    Social Determinants of Health   Financial Resource Strain: Not on file  Food Insecurity: No Food Insecurity (08/24/2022)   Hunger Vital Sign    Worried About Running Out of Food in the Last Year: Never true    Ran Out of Food in the Last Year: Never true  Transportation Needs: Not on file  Physical Activity: Not on file  Stress: Not on file  Social Connections: Not  on file  Intimate Partner Violence: Not At Risk (08/24/2022)   Humiliation, Afraid, Rape, and Kick questionnaire    Fear of Current or Ex-Partner: No    Emotionally Abused: No    Physically Abused: No    Sexually Abused: No    Family History:    Family History  Adopted: Yes  Problem Relation Age of Onset   Drug abuse Mother    Healthy Father      ROS:  Please see the history of present illness.   All other ROS reviewed and negative.     Physical Exam/Data:   Vitals:   09/05/22 1731 09/05/22 1735 09/05/22 1746 09/05/22 1801  BP: (!) 156/102  (!) 137/99 (!) 162/101  Pulse: 72  73 78  Resp: 18     Temp:      TempSrc:      SpO2:  97%    Weight:      Height:       No intake or output data in the 24 hours ending 09/05/22 1814    09/05/2022    5:08 PM 08/24/2022    3:10 PM 04/27/2022    9:50 AM  Last 3 Weights  Weight (lbs) 252 lb 287 lb 8 oz 261 lb 3.2 oz  Weight (kg) 114.306 kg 130.409 kg 118.48 kg     Body mass index is 41.93 kg/m.  General:  Obese 30 yo in no acute distress HEENT: normal Neck: no JVD Vascular: No carotid bruits; Distal pulses 2+ bilaterally Cardiac:  normal S1, S2; RRR;  Lungs:  clear to auscultation bilaterally, no wheezing, rhonchi or rales  Abd: soft, nontender, no hepatomegaly  Ext: no edema Musculoskeletal:  No deformities, BUE and BLE strength normal and equal Skin: warm and dry  Neuro:  CNs 2-12 intact, no focal abnormalities noted Psych:  Normal affect   EKG:  Not done  Telemetry:  Telemetry was personally reviewed and demonstrates:    Relevant CV Studies: NOne   Laboratory Data:  High Sensitivity Troponin:  No results for input(s): "TROPONINIHS" in the last 720 hours.   ChemistryNo results for input(s): "NA", "K", "CL", "CO2", "GLUCOSE", "BUN", "CREATININE", "CALCIUM", "MG", "GFRNONAA", "GFRAA", "ANIONGAP" in the last 168 hours.  No results for input(s): "PROT", "ALBUMIN", "AST", "ALT", "ALKPHOS", "BILITOT" in the last 168  hours. Lipids No results for input(s): "CHOL", "TRIG", "HDL", "LABVLDL", "LDLCALC", "CHOLHDL" in the last 168 hours.  HematologyNo results for input(s): "WBC", "RBC", "HGB", "HCT", "MCV", "MCH", "MCHC", "RDW", "PLT" in the last 168 hours. Thyroid No results for input(s): "TSH", "FREET4" in the last 168 hours.  BNPNo results for input(s): "BNP", "  PROBNP" in the last 168 hours.  DDimer No results for input(s): "DDIMER" in the last 168 hours.   Radiology/Studies:  No results found.   Assessment/Plan  The pt is a 31 yo with recent episode of preeclampsia.     Sent home on hydralazine and labetalol and short course HCTZ   Has continued to have high BP readings   Presents for evaluation  / Rx     She has received IV labetalol as well as IV magnesium  I would recomm increasing hydralazine to 75 tid  and labetalol to 400 bid ( her pulse at home has been in the 70s to 80s so she should have room to go up)   Ultimately would like to get her on something that is more streamlined than hydralazine  but would get BP under control for now.      WIll continue to follow   If still high would add nifedipine ER 30 mg   Dorris Carnes MD       For questions or updates, please contact [place] Please consult [email or web address] for contact info under    Signed, Dorris Carnes, MD  09/05/2022 6:14 PM

## 2022-09-05 NOTE — MAU Note (Signed)
Brandi Wise is a 31 y.o. at Unknown here in MAU reporting: sent from [facility] office for BP evaluation.  Reports current H/A, denies visual disturbances or epigastric pain. S/P NVD 08/26/2022 LMP: N/A Onset of complaint: today Pain score: 6 Vitals:   09/05/22 1712  BP: (!) 161/112  Pulse: 79  Resp: 18  Temp: 98.1 F (36.7 C)  SpO2: 98%     FHT:N/A Lab orders placed from triage:   None

## 2022-09-05 NOTE — Telephone Encounter (Signed)
Patient states she is currently in [facility] MAU to be seen. Will attempt to call patient back at a later time.   Sharyn Lull [facility]  09/05/22,1821

## 2022-09-05 NOTE — H&P (Signed)
Brandi Wise is an 31 y.o. female. [id number] BF S/P VAVD 9/29  after she presented with severe range BP and had magnesium sulfate x 24 hrs and was discharged home on labetalol , hydralazine and a short course of HCTZ added for peripheral edema. Pt called the office with c/o elevated BP 189/124. Pt presented to office where she was found to have BP 160/100 . (+) slight headache. Pt had not taken any medication. Denied visual changes or epigastric pain. No leg swelling. Bottlefeeding  Pertinent Gynecological History:  OB History: [id number]  Menstrual History: Menarche age: n/a Patient's last menstrual period was 09/24/2021 (approximate).    Past Medical History:  Diagnosis Date   Knee pain    Medical history non-contributory    Pre-diabetes    Preeclampsia in postpartum period 09/05/2022   Pregnancy induced hypertension   .  Past Surgical History:  Procedure Laterality Date   POLYPECTOMY     THYROIDECTOMY Left 05/19/2021   Procedure: THYROID LOBECTOMY;  Surgeon: Melida Quitter, MD;  Location: [facility] OR;  Service: ENT;  Laterality: Left;    Family History  Adopted: Yes  Problem Relation Age of Onset   Drug abuse Mother    Healthy Father     Social History:  reports that she has never smoked. She has never used smokeless tobacco. She reports that she does not currently use alcohol. She reports that she does not use drugs.  Allergies: No Known Allergies  Medications Prior to Admission  Medication Sig Dispense Refill Last Dose   ferrous sulfate 324 MG TBEC Take 1 tablet (324 mg total) by mouth 2 (two) times daily. 60 tablet 11 43/11/5398   folic acid (FOLVITE) 1 MG tablet Take 1 tablet (1 mg total) by mouth daily. 90 tablet 2 09/05/2022   hydrALAZINE (APRESOLINE) 50 MG tablet Take 1 tablet (50 mg total) by mouth every 8 (eight) hours. 90 tablet 0 09/05/2022   ibuprofen (ADVIL) 600 MG tablet Take 1 tablet (600 mg total) by mouth every 6 (six) hours. 30 tablet 11 09/04/2022   labetalol  (NORMODYNE) 200 MG tablet Take 1 tablet (200 mg total) by mouth 2 (two) times daily. 60 tablet 1 09/05/2022   levothyroxine (SYNTHROID) 75 MCG tablet Take 1 tablet (75 mcg total) by mouth daily. 30 tablet 3 09/05/2022   Prenatal Vit-Fe Fumarate-FA (MULTIVITAMIN-PRENATAL) 27-0.8 MG TABS tablet Take 1 tablet by mouth daily at 12 noon.   09/04/2022   hydrochlorothiazide (HYDRODIURIL) 25 MG tablet Take 1 tablet (25 mg total) by mouth daily for 5 days. 5 tablet 0    mineral oil liquid 30 ml 180 mL 0 Unknown    Review of Systems  Respiratory:  Negative for shortness of breath.   Cardiovascular:  Negative for leg swelling.  Neurological:  Positive for headaches.    Blood pressure (S) (!) 162/98, pulse 71, temperature 98.1 F (36.7 C), temperature source Oral, resp. rate 18, height '5\' 5"'$  (1.651 m), weight 114.3 kg, last menstrual period 09/24/2021, SpO2 97 %, unknown if currently breastfeeding. Physical Exam Constitutional:      Appearance: Normal appearance. She is obese.  Eyes:     Extraocular Movements: Extraocular movements intact.  Cardiovascular:     Rate and Rhythm: Regular rhythm.     Heart sounds: Normal heart sounds.  Pulmonary:     Breath sounds: Normal breath sounds.  Abdominal:     Comments: Uterus firm   Musculoskeletal:        General: No swelling.  Cervical back: Neck supple.  Skin:    General: Skin is warm and dry.  Neurological:     General: No focal deficit present.     Mental Status: She is alert and oriented to person, place, and time.  Psychiatric:        Mood and Affect: Mood normal.        Behavior: Behavior normal.     No results found for this or any previous visit (from the past 24 hour(s)).  No results found.  Assessment/Plan: Postpartum preeclampsia P) OBS. Cardiology consult for help with HTN mgmt. [place] labs. Increase dose of labetalol and continue hydralazine. Magnesium sulfate for seizure prophylaxis  [name] 09/05/2022, 6:25  PM Addendum    Latest Ref Rng & Units 09/05/2022    6:08 PM 08/27/2022    4:01 AM 08/26/2022    4:06 AM  CBC  WBC 4.0 - 10.5 K/uL 12.3  18.3  20.4   Hemoglobin 12.0 - 15.0 g/dL 11.3  9.0  12.1   Hematocrit 36.0 - 46.0 % 35.2  27.0  36.7   Platelets 150 - 400 K/uL 645  299  396        Latest Ref Rng & Units 09/05/2022    6:08 PM 08/26/2022    5:12 AM 08/25/2022    7:42 AM  CMP  Glucose 70 - 99 mg/dL 84  125  94   BUN 6 - 20 mg/dL 11  <5  <5   Creatinine 0.44 - 1.00 mg/dL 0.81  0.83  0.60   Sodium 135 - 145 mmol/L 137  138  134   Potassium 3.5 - 5.1 mmol/L 3.7  4.3  3.1   Chloride 98 - 111 mmol/L 108  108  108   CO2 22 - 32 mmol/L '18  20  20   '$ Calcium 8.9 - 10.3 mg/dL 8.5  8.0  7.2   Total Protein 6.5 - 8.1 g/dL 7.0  6.0  5.8   Total Bilirubin 0.3 - 1.2 mg/dL 0.2  0.7  0.5   Alkaline Phos 38 - 126 U/L 85  94  96   AST 15 - 41 U/L '24  31  24   '$ ALT 0 - 44 U/L '23  23  21    '$ PCR pending

## 2022-09-06 DIAGNOSIS — I10 Essential (primary) hypertension: Secondary | ICD-10-CM | POA: Diagnosis not present

## 2022-09-06 DIAGNOSIS — E039 Hypothyroidism, unspecified: Secondary | ICD-10-CM | POA: Diagnosis not present

## 2022-09-06 DIAGNOSIS — O99285 Endocrine, nutritional and metabolic diseases complicating the puerperium: Secondary | ICD-10-CM | POA: Diagnosis not present

## 2022-09-06 DIAGNOSIS — O1495 Unspecified pre-eclampsia, complicating the puerperium: Secondary | ICD-10-CM

## 2022-09-06 DIAGNOSIS — Z79899 Other long term (current) drug therapy: Secondary | ICD-10-CM | POA: Diagnosis not present

## 2022-09-06 MED ORDER — LABETALOL HCL 200 MG PO TABS: 400.0000 mg | ORAL_TABLET | Freq: Two times a day (BID) | ORAL | 5 refills | Status: DC

## 2022-09-06 MED ORDER — LEVOTHYROXINE SODIUM 25 MCG PO TABS: 75.0000 ug | ORAL_TABLET | Freq: Every day | ORAL | Status: DC

## 2022-09-06 MED ORDER — AMLODIPINE BESYLATE 5 MG PO TABS
2.5000 mg | ORAL_TABLET | Freq: Every day | ORAL | Status: DC
  Administered 2022-09-06: 2.5 mg via ORAL
  Filled 2022-09-06: qty 1

## 2022-09-06 MED ORDER — AMLODIPINE BESYLATE 2.5 MG PO TABS: 2.5000 mg | ORAL_TABLET | Freq: Every day | ORAL | 11 refills | Status: DC

## 2022-09-06 NOTE — Discharge Instructions (Signed)
Resume postpartum orders 

## 2022-09-06 NOTE — Progress Notes (Signed)
Rounding Note    Patient Name: Brandi Wise Date of Encounter: 09/06/2022  [place] Cardiologist: None   Subjective   Pt feels OK  No CP  No SOB   Inpatient Medications    Scheduled Meds:  hydrALAZINE  50 mg Oral Q8H   labetalol  400 mg Oral BID   Continuous Infusions:  lactated ringers 10 mL/hr at 09/05/22 1858   magnesium sulfate 2 g/hr (09/05/22 1902)   PRN Meds: labetalol **AND** labetalol **AND** labetalol **AND** hydrALAZINE **AND** Measure blood pressure   Vital Signs    Vitals:   09/06/22 0000 09/06/22 0108 09/06/22 0203 09/06/22 0303  BP:  (!) 140/92 (!) 140/87 129/84  Pulse:  80 79 80  Resp:  '16 16 17  '$ Temp:      TempSrc:      SpO2: 97% 98% 98% 98%  Weight:      Height:        Intake/Output Summary (Last 24 hours) at 09/06/2022 0626 Last data filed at 09/06/2022 0500 Gross per 24 hour  Intake 1312.12 ml  Output 2100 ml  Net -787.88 ml      09/05/2022    5:08 PM 08/24/2022    3:10 PM 04/27/2022    9:50 AM  Last 3 Weights  Weight (lbs) 252 lb 287 lb 8 oz 261 lb 3.2 oz  Weight (kg) 114.306 kg 130.409 kg 118.48 kg       Physical Exam   GEN: No acute distress.   Neck: No JVD Cardiac: RRR, no murmurs Respiratory: Clear to auscultation bilaterally. GI: Soft, nontender, non-distended  MS: No edema; No deformity. Neuro:  Nonfocal  Psych: Normal affect   Labs    High Sensitivity Troponin:  No results for input(s): "TROPONINIHS" in the last 720 hours.   Chemistry Recent Labs  Lab 09/05/22 1808  NA 137  K 3.7  CL 108  CO2 18*  GLUCOSE 84  BUN 11  CREATININE 0.81  CALCIUM 8.5*  PROT 7.0  ALBUMIN 3.3*  AST 24  ALT 23  ALKPHOS 85  BILITOT 0.2*  GFRNONAA >60  ANIONGAP 11    Lipids No results for input(s): "CHOL", "TRIG", "HDL", "LABVLDL", "LDLCALC", "CHOLHDL" in the last 168 hours.  Hematology Recent Labs  Lab 09/05/22 1808  WBC 12.3*  RBC 4.01  HGB 11.3*  HCT 35.2*  MCV 87.8  MCH 28.2  MCHC 32.1  RDW  14.4  PLT 645*   Thyroid No results for input(s): "TSH", "FREET4" in the last 168 hours.  BNPNo results for input(s): "BNP", "PROBNP" in the last 168 hours.  DDimer No results for input(s): "DDIMER" in the last 168 hours.   Radiology    No results found.  Cardiac Studies   NOne  Patient Profile     31 y.o. female with his of preeclampsia with recent pregnancy/delivery admitted yesterday for control of HTN  1  HTN  BP is improved from yesteday significanly   Still a little high  She is getting Mg now Will add amlodipine 2.5 mg daily   Quickly increase to 5 if tolerates   Continue hydralazine 75 tid and labetalol now at 400 bid    (HR 80s)     When patient is ready for d/c I will make sure she has close follow up in either our womens clinic, HTN clinci or with me.    For questions or updates, please contact [place] Please consult [email or web address] for contact info under  Signed, Dorris Carnes, MD  09/06/2022, 6:26 AM

## 2022-09-06 NOTE — Progress Notes (Signed)
S. No complaint. Denies h/a, visual changes   O: BP 134/88   Pulse 81   Temp 98.1 F (36.7 C) (Oral)   Resp 16   Ht '5\' 5"'$  (1.651 m)   Wt 114.3 kg   LMP 09/24/2021 (Approximate)   SpO2 100%   BMI 41.93 kg/m  Patient Vitals for the past 24 hrs:  BP Temp Temp src Pulse Resp SpO2  09/06/22 1457 134/88 98.1 F (36.7 C) Oral 81 16 100 %  09/06/22 1146 105/74 98.2 F (36.8 C) Oral 85 16 100 %  09/06/22 0900 -- -- -- -- 17 --  09/06/22 0800 -- -- -- -- 19 --  09/06/22 0702 (!) 138/95 98.1 F (36.7 C) Oral 90 18 99 %  09/06/22 0600 -- -- -- -- 18 --  09/06/22 0303 129/84 -- -- 80 17 98 %  09/06/22 0203 (!) 140/87 -- -- 79 16 98 %  09/06/22 0108 (!) 140/92 -- -- 80 16 98 %  09/06/22 0000 -- -- -- -- -- 97 %  09/05/22 2359 (!) 140/92 98.3 F (36.8 C) Oral 87 -- --  09/05/22 2305 131/84 -- -- 85 16 98 %  09/05/22 2211 (!) 120/91 -- -- 89 18 98 %  09/05/22 2105 -- -- -- -- -- 98 %  09/05/22 2100 128/82 -- -- 84 18 --  09/05/22 2059 -- -- -- -- -- 98 %  09/05/22 2027 (!) 171/102 -- -- 83 -- --  09/05/22 2005 (!) 174/86 98.2 F (36.8 C) Oral 69 18 100 %  09/05/22 1954 (!) 154/92 -- -- 72 -- --  09/05/22 1930 (!) 144/86 -- -- 70 -- 99 %  09/05/22 1920 (!) 144/95 -- -- 81 -- 99 %  09/05/22 1915 -- -- -- -- -- 98 %  09/05/22 1910 (!) 144/95 -- -- 74 18 99 %  09/05/22 1905 -- -- -- -- -- 98 %  09/05/22 1904 (!) 159/95 -- -- 68 -- --  09/05/22 1901 (!) 167/89 -- -- 69 18 --  09/05/22 1846 (!) 149/97 -- -- 70 -- --  09/05/22 1831 (!) 192/104 -- -- 73 -- --  09/05/22 1816 (!) 162/98 -- -- 71 -- --  09/05/22 1801 (!) 162/101 -- -- 78 -- --  09/05/22 1746 (!) 137/99 -- -- 73 -- --   O: lungs clear to A  Cor RRR Ext no edema or calf tenderness  IMP: postpartum preeclampsia s/p Magnesium sulfate HTN mgmt per cardiology. Amlodipine added Hypothyroidism  P) d/c home  Continue labetalol, hydralazine, amlodipine Cont synthroid F/u arrangement to OB HTN clinic per on call Dr Curly Shores

## 2022-09-07 ENCOUNTER — Telehealth (HOSPITAL_COMMUNITY): Payer: Self-pay | Admitting: *Deleted

## 2022-09-07 NOTE — Telephone Encounter (Signed)
Hospital Discharge Follow-Up Call:  Patient was readmitted to [facility] postpartum for preeclampsia treatment/ blood pressure control.  Had a cardiology consult while in hospital.  Discharged yesterday evening with different medication plan for BP control.  She reports that she is well and has no concerns about how she is healing from her birth experience. EPDS today was 0 and she endorses this accurately reflects that she is doing well emotionally.  Patient says that baby is well and she has no concerns about baby's health.  She reports that baby sleeps in a bedside bassinet.  Reviewed ABCs of Safe Sleep.

## 2022-09-08 ENCOUNTER — Telehealth: Payer: Self-pay

## 2022-09-08 NOTE — Telephone Encounter (Signed)
Pt has an appt with Dr Harriet Masson at the [facility] on 09/23/22.

## 2022-09-08 NOTE — Telephone Encounter (Signed)
-----   Message from Fay Records, MD sent at 09/07/2022  5:49 AM EDT ----- OK to overbook with me if needed ----- Message ----- From: Leanor Kail, PA Sent: 09/06/2022   5:42 PM EDT To: Fay Records, MD; [address] Scheduling  31 y.o. female with his of preeclampsia with recent pregnancy/delivery admitted  for control of HTN.  She needs 1-2 weeks follow up in HTN clinic or woman's OB clinic. If no opening, follow up with Dr. Harrington Challenger.   Thanks.

## 2022-09-08 NOTE — Discharge Summary (Signed)
Physician Discharge Summary  Patient ID: Brandi Wise MRN: [id number] DOB/AGE: 33-Jun-1992 33 y.o.  Admit date: 09/05/2022 Discharge date: 09/06/2022  Admission Diagnoses: postpartum preeclampsia  Discharge Diagnoses: same, hypothyroidism Principal Problem:   Preeclampsia in postpartum period   Discharged Condition: stable  Hospital Course:  pt presented to  MAU where she was found to have severe range BP. Pt was started on Magnesium sulfate . Cardiology consult for BP mgmt. She had increase in dose of labetalol, hydralazine was continued. She was continued on magnesium Sulfate. HTN protocol used for severe range BP. She had addition of amlodipine medication  Consults: cardiology  Significant Diagnostic Studies: labs:     Latest Ref Rng & Units 09/05/2022    6:08 PM 08/27/2022    4:01 AM 08/26/2022    4:06 AM  CBC  WBC 4.0 - 10.5 K/uL 12.3  18.3  20.4   Hemoglobin 12.0 - 15.0 g/dL 11.3  9.0  12.1   Hematocrit 36.0 - 46.0 % 35.2  27.0  36.7   Platelets 150 - 400 K/uL 645  299  396        Latest Ref Rng & Units 09/05/2022    6:08 PM 08/26/2022    5:12 AM 08/25/2022    7:42 AM  CMP  Glucose 70 - 99 mg/dL 84  125  94   BUN 6 - 20 mg/dL 11  <5  <5   Creatinine 0.44 - 1.00 mg/dL 0.81  0.83  0.60   Sodium 135 - 145 mmol/L 137  138  134   Potassium 3.5 - 5.1 mmol/L 3.7  4.3  3.1   Chloride 98 - 111 mmol/L 108  108  108   CO2 22 - 32 mmol/L '18  20  20   '$ Calcium 8.9 - 10.3 mg/dL 8.5  8.0  7.2   Total Protein 6.5 - 8.1 g/dL 7.0  6.0  5.8   Total Bilirubin 0.3 - 1.2 mg/dL 0.2  0.7  0.5   Alkaline Phos 38 - 126 U/L 85  94  96   AST 15 - 41 U/L '24  31  24   '$ ALT 0 - 44 U/L '23  23  21       '$ Treatments: IV magnesium sulfate, HTN protocol  Discharge Exam: Blood pressure 134/88, pulse 81, temperature 98.1 F (36.7 C), temperature source Oral, resp. rate 16, height '5\' 5"'$  (1.651 m), weight 114.3 kg, last menstrual period 09/24/2021, SpO2 100 %, unknown if currently  breastfeeding.   Disposition: Discharge disposition: 01-Home or Self Care       Discharge Instructions     Activity as tolerated   Complete by: As directed    Call MD for:   Complete by: As directed    Soaking a regular maxi pad every hour or more frequently, passing large clots  For C-section:  Call if incision red, draining or increased pain. Passing clots, nausea, vomiting , severe abdominal pain   Call MD for:  temperature >100.4   Complete by: As directed    Diet - low sodium heart healthy   Complete by: As directed    Discharge instructions   Complete by: As directed    For C-section: no heavy lifting(>25 lbs ) for two weeks, No driving for two weeks.      Allergies as of 09/06/2022   No Known Allergies      Medication List     STOP taking these medications    hydrochlorothiazide 25 MG tablet  Commonly known as: HYDRODIURIL       TAKE these medications    amLODipine 2.5 MG tablet Commonly known as: NORVASC Take 1 tablet (2.5 mg total) by mouth daily.   ferrous sulfate 324 MG Tbec Take 1 tablet (324 mg total) by mouth 2 (two) times daily.   folic acid 1 MG tablet Commonly known as: FOLVITE Take 1 tablet (1 mg total) by mouth daily.   hydrALAZINE 50 MG tablet Commonly known as: APRESOLINE Take 1 tablet (50 mg total) by mouth every 8 (eight) hours.   ibuprofen 600 MG tablet Commonly known as: ADVIL Take 1 tablet (600 mg total) by mouth every 6 (six) hours.   labetalol 200 MG tablet Commonly known as: NORMODYNE Take 2 tablets (400 mg total) by mouth 2 (two) times daily. What changed: how much to take   levothyroxine 75 MCG tablet Commonly known as: SYNTHROID Take 1 tablet (75 mcg total) by mouth daily.   mineral oil liquid 30 ml   multivitamin-prenatal 27-0.8 MG Tabs tablet Take 1 tablet by mouth daily at 12 noon.        Follow-up Information     Fay Records, MD Follow up.   Specialty: Cardiology Why: office will call with  date and time Contact information: [address] [place] [address] [phone]                 Signed: Marvene Staff 09/08/2022, 4:34 AM

## 2022-09-09 ENCOUNTER — Inpatient Hospital Stay (HOSPITAL_COMMUNITY): Admit: 2022-09-09 | Payer: Self-pay

## 2022-09-12 ENCOUNTER — Encounter: Payer: Self-pay | Admitting: Cardiology

## 2022-09-12 ENCOUNTER — Other Ambulatory Visit (HOSPITAL_COMMUNITY): Payer: Self-pay

## 2022-09-12 ENCOUNTER — Ambulatory Visit: Payer: 59 | Attending: Cardiology | Admitting: Cardiology

## 2022-09-12 VITALS — BP 100/76 | HR 84 | Ht 65.0 in | Wt 250.6 lb

## 2022-09-12 DIAGNOSIS — O1003 Pre-existing essential hypertension complicating the puerperium: Secondary | ICD-10-CM | POA: Diagnosis not present

## 2022-09-12 DIAGNOSIS — O1493 Unspecified pre-eclampsia, third trimester: Secondary | ICD-10-CM

## 2022-09-12 DIAGNOSIS — O165 Unspecified maternal hypertension, complicating the puerperium: Secondary | ICD-10-CM | POA: Insufficient documentation

## 2022-09-12 DIAGNOSIS — I10 Essential (primary) hypertension: Secondary | ICD-10-CM | POA: Insufficient documentation

## 2022-09-12 MED ORDER — AMLODIPINE BESYLATE 5 MG PO TABS
5.0000 mg | ORAL_TABLET | Freq: Every day | ORAL | 2 refills | Status: DC
  Filled 2022-09-12: qty 90, 90d supply, fill #0
  Filled 2023-02-09: qty 90, 90d supply, fill #1

## 2022-09-12 NOTE — Progress Notes (Signed)
[facility]io[facility]st[facility]s Clinic  New Evaluation  Date:  09/12/2022   ID:  Brandi Wise, DOB December 25, 1990, MRN [id number]  PCP:  Hoyt Koch, MD   [facility] Cardiologist:  Berniece Salines, DO  Electrophysiologist:  None       Referring MD: Hoyt Koch, *   Chief Complaint: " I am ok"  History of Present Illness:    Brandi Wise is a 31 y.o. female [G3P1021] who is being seen today for the evaluation of hypertensin at the request of Hoyt Koch, *.  Medical history includes prediabetes, preeclampsia in the postpartum.,  Obesity here today to be evaluated.  He was recently admitted at [facility] postpartum day 2 preeclampsia.  At which time her antihypertensive for optimize, she was discharged on amlodipine 2.5 mg daily, hydralazine 50 mg every 8 hours and labetalol 400 mg twice a day.  She has been taking her blood pressure at home and tells me that this is still the visit.  I was able to see her blood pressure reading and most of her systolics are still in the 101B and her diastolics is in 51W.  No other complaints at this time.   Prior CV Studies Reviewed: The following studies were reviewed today: None today  Past Medical History:  Diagnosis Date   Knee pain    Medical history non-contributory    Pre-diabetes    Preeclampsia in postpartum period 09/05/2022   Pregnancy induced hypertension     Past Surgical History:  Procedure Laterality Date   POLYPECTOMY     THYROIDECTOMY Left 05/19/2021   Procedure: THYROID LOBECTOMY;  Surgeon: Melida Quitter, MD;  Location: [facility];  Service: ENT;  Laterality: Left;      OB History     Gravida  3   Para  1   Term  1   Preterm      AB  2   Living  1      SAB  2   IAB      Ectopic      Multiple  0   Live Births  1               Current Medications: Current Meds  Medication Sig   ferrous sulfate 324 MG TBEC Take 1 tablet (324 mg total) by mouth 2 (two)  times daily.   folic acid (FOLVITE) 1 MG tablet Take 1 tablet (1 mg total) by mouth daily.   hydrALAZINE (APRESOLINE) 50 MG tablet Take 1 tablet (50 mg total) by mouth every 8 (eight) hours.   ibuprofen (ADVIL) 600 MG tablet Take 1 tablet (600 mg total) by mouth every 6 (six) hours.   labetalol (NORMODYNE) 200 MG tablet Take 2 tablets (400 mg total) by mouth 2 (two) times daily.   levothyroxine (SYNTHROID) 75 MCG tablet Take 1 tablet (75 mcg total) by mouth daily.   Prenatal Vit-Fe Fumarate-FA (MULTIVITAMIN-PRENATAL) 27-0.8 MG TABS tablet Take 1 tablet by mouth daily at 12 noon.   [DISCONTINUED] amLODipine (NORVASC) 2.5 MG tablet Take 1 tablet (2.5 mg total) by mouth daily.     Allergies:   Patient has no known allergies.   Social History   Socioeconomic History   Marital status: Single    Spouse name: Not on file   Number of children: Not on file   Years of education: Not on file   Highest education level: Not on file  Occupational History   Occupation: behavioral health/clinical social worker  Tobacco Use   Smoking status: Never   Smokeless tobacco: Never  Vaping Use   Vaping Use: Never used  Substance and Sexual Activity   Alcohol use: Not Currently    Comment: socially   Drug use: Never   Sexual activity: Not Currently  Other Topics Concern   Not on file  Social History Narrative   Right handed   Lives alone third fl apartment    Social Determinants of Health   Financial Resource Strain: Not on file  Food Insecurity: No Food Insecurity (09/05/2022)   Hunger Vital Sign    Worried About Running Out of Food in the Last Year: Never true    Ran Out of Food in the Last Year: Never true  Transportation Needs: No Transportation Needs (09/05/2022)   PRAPARE - Hydrologist (Medical): No    Lack of Transportation (Non-Medical): No  Physical Activity: Not on file  Stress: Not on file  Social Connections: Not on file      Family History   Adopted: Yes  Problem Relation Age of Onset   Drug abuse Mother    Healthy Father       ROS:   Please see the history of present illness.    3- All other systems reviewed and are negative.   Labs/EKG Reviewed:    EKG:   EKG is was ordered today.  The ekg ordered today demonstrates sinus rhythm, heart rate 84 bpm.  Recent Labs: 04/27/2022: TSH 1.45 08/25/2022: Magnesium 4.2 09/05/2022: ALT 23; BUN 11; Creatinine, Ser 0.81; Hemoglobin 11.3; Platelets 645; Potassium 3.7; Sodium 137   Recent Lipid Panel Lab Results  Component Value Date/Time   CHOL 160 12/25/2020 08:42 AM   TRIG 39 12/25/2020 08:42 AM   HDL 54 12/25/2020 08:42 AM   LDLCALC 97 12/25/2020 08:42 AM    Physical Exam:    VS:  BP 100/76 (BP Location: Right Arm, Patient Position: Sitting, Cuff Size: Large)   Pulse 84   Ht '5\' 5"'$  (1.651 m)   Wt 250 lb 9.6 oz (113.7 kg)   LMP 09/24/2021 (Approximate)   SpO2 98%   BMI 41.70 kg/m     Wt Readings from Last 3 Encounters:  09/12/22 250 lb 9.6 oz (113.7 kg)  09/05/22 252 lb (114.3 kg)  08/24/22 287 lb 8 oz (130.4 kg)     GEN:  Well nourished, well developed in no acute distress HEENT: Normal NECK: No JVD; No carotid bruits LYMPHATICS: No lymphadenopathy CARDIAC: RRR, no murmurs, rubs, gallops RESPIRATORY:  Clear to auscultation without rales, wheezing or rhonchi  ABDOMEN: Soft, non-tender, non-distended MUSCULOSKELETAL:  No edema; No deformity  SKIN: Warm and dry NEUROLOGIC:  Alert and oriented x 3 PSYCHIATRIC:  Normal affect    Risk Assessment/Risk Calculators:                 ASSESSMENT & PLAN:    Postpartum hypertension Postpartum preeclampsia Obesity  Manage today in the office for me she is 132/86 mmHg.  I reviewed her home blood pressure readings which are also elevated.  We will increase her amlodipine to 5 mg daily.  We will continue the hydralazine at 25 mg twice a day as well as her labetalol 40 mg twice daily. Have ordered  echocardiogram to assess for any structural abnormalities including left ventricle hypertrophy.  We have discussed she will send me updated blood pressure in 2 weeks.  The patient is in agreement with the above plan. The  patient left the office in stable condition.  The patient will follow up in 12 weeks or sooner if needed.  Patient Instructions  Medication Instructions:  Your physician has recommended you make the following change in your medication:  INCREASE: Amlodipine '5mg'$  daily.  *If you need a refill on your cardiac medications before your next appointment, please call your pharmacy*  Please take your blood pressure daily for 2 weeks and send in a MyChart message. Please include heart rates.   HOW TO TAKE YOUR BLOOD PRESSURE: Rest 5 minutes before taking your blood pressure. Don't smoke or drink caffeinated beverages for at least 30 minutes before. Take your blood pressure before (not after) you eat. Sit comfortably with your back supported and both feet on the floor (don't cross your legs). Elevate your arm to heart level on a table or a desk. Use the proper sized cuff. It should fit smoothly and snugly around your bare upper arm. There should be enough room to slip a fingertip under the cuff. The bottom edge of the cuff should be 1 inch above the crease of the elbow. Ideally, take 3 measurements at one sitting and record the average.    Lab Work: NONE ordered at this time of appointment  If you have labs (blood work) drawn today and your tests are completely normal, you will receive your results only by: [place] (if you have MyChart) OR A paper copy in the mail If you have any lab test that is abnormal or we need to change your treatment, we will call you to review the results.   Testing/Procedures: Your physician has requested that you have an echocardiogram. Echocardiography is a painless test that uses sound waves to create images of your heart. It provides your  doctor with information about the size and shape of your heart and how well your heart's chambers and valves are working. This procedure takes approximately one hour. There are no restrictions for this procedure. Please do NOT wear cologne, perfume, aftershave, or lotions (deodorant is allowed). Please arrive 15 minutes prior to your appointment time.    Follow-Up: At [facility], you and your health needs are our priority.  As part of our continuing mission to provide you with exceptional heart care, we have created designated Provider Care Teams.  These Care Teams include your primary Cardiologist (physician) and Advanced Practice Providers (APPs -  Physician Assistants and Nurse Practitioners) who all work together to provide you with the care you need, when you need it.  We recommend signing up for the patient portal called "MyChart".  Sign up information is provided on this After Visit Summary.  MyChart is used to connect with patients for Virtual Visits (Telemedicine).  Patients are able to view lab/test results, encounter notes, upcoming appointments, etc.  Non-urgent messages can be sent to your provider as well.   To learn more about what you can do with MyChart, go to [email or web address].    Your next appointment:   12 week(s)  The format for your next appointment:   In Person  Provider:   Berniece Salines, DO      Dispo:  Return in about 12 weeks (around 12/05/2022).   Medication Adjustments/Labs and Tests Ordered: Current medicines are reviewed at length with the patient today.  Concerns regarding medicines are outlined above.  Tests Ordered: Orders Placed This Encounter  Procedures   EKG 12-Lead   ECHOCARDIOGRAM COMPLETE   Medication Changes: Meds ordered this encounter  Medications  amLODipine (NORVASC) 5 MG tablet    Sig: Take 1 tablet (5 mg total) by mouth daily.    Dispense:  90 tablet    Refill:  2

## 2022-09-12 NOTE — Patient Instructions (Addendum)
Medication Instructions:  Your physician has recommended you make the following change in your medication:  INCREASE: Amlodipine '5mg'$  daily.  *If you need a refill on your cardiac medications before your next appointment, please call your pharmacy*  Please take your blood pressure daily for 2 weeks and send in a MyChart message. Please include heart rates.   HOW TO TAKE YOUR BLOOD PRESSURE: Rest 5 minutes before taking your blood pressure. Don't smoke or drink caffeinated beverages for at least 30 minutes before. Take your blood pressure before (not after) you eat. Sit comfortably with your back supported and both feet on the floor (don't cross your legs). Elevate your arm to heart level on a table or a desk. Use the proper sized cuff. It should fit smoothly and snugly around your bare upper arm. There should be enough room to slip a fingertip under the cuff. The bottom edge of the cuff should be 1 inch above the crease of the elbow. Ideally, take 3 measurements at one sitting and record the average.    Lab Work: NONE ordered at this time of appointment  If you have labs (blood work) drawn today and your tests are completely normal, you will receive your results only by: [place] (if you have MyChart) OR A paper copy in the mail If you have any lab test that is abnormal or we need to change your treatment, we will call you to review the results.   Testing/Procedures: Your physician has requested that you have an echocardiogram. Echocardiography is a painless test that uses sound waves to create images of your heart. It provides your doctor with information about the size and shape of your heart and how well your heart's chambers and valves are working. This procedure takes approximately one hour. There are no restrictions for this procedure. Please do NOT wear cologne, perfume, aftershave, or lotions (deodorant is allowed). Please arrive 15 minutes prior to your appointment time.     Follow-Up: At [facility], you and your health needs are our priority.  As part of our continuing mission to provide you with exceptional heart care, we have created designated Provider Care Teams.  These Care Teams include your primary Cardiologist (physician) and Advanced Practice Providers (APPs -  Physician Assistants and Nurse Practitioners) who all work together to provide you with the care you need, when you need it.  We recommend signing up for the patient portal called "MyChart".  Sign up information is provided on this After Visit Summary.  MyChart is used to connect with patients for Virtual Visits (Telemedicine).  Patients are able to view lab/test results, encounter notes, upcoming appointments, etc.  Non-urgent messages can be sent to your provider as well.   To learn more about what you can do with MyChart, go to [email or web address].    Your next appointment:   12 week(s)  The format for your next appointment:   In Person  Provider:   Berniece Salines, DO

## 2022-09-13 ENCOUNTER — Other Ambulatory Visit (HOSPITAL_COMMUNITY): Payer: Self-pay

## 2022-09-15 ENCOUNTER — Other Ambulatory Visit (INDEPENDENT_AMBULATORY_CARE_PROVIDER_SITE_OTHER): Payer: 59

## 2022-09-15 ENCOUNTER — Other Ambulatory Visit: Payer: Self-pay | Admitting: Internal Medicine

## 2022-09-15 DIAGNOSIS — E890 Postprocedural hypothyroidism: Secondary | ICD-10-CM

## 2022-09-15 LAB — T4, FREE: Free T4: 0.88 ng/dL (ref 0.60–1.60)

## 2022-09-15 LAB — TSH: TSH: 0.5 u[IU]/mL (ref 0.35–5.50)

## 2022-09-16 ENCOUNTER — Other Ambulatory Visit (HOSPITAL_COMMUNITY): Payer: Self-pay

## 2022-09-16 MED ORDER — LEVOTHYROXINE SODIUM 75 MCG PO TABS
75.0000 ug | ORAL_TABLET | Freq: Every day | ORAL | 3 refills | Status: DC
  Filled 2022-09-16: qty 30, 30d supply, fill #0
  Filled 2022-10-25: qty 30, 30d supply, fill #1
  Filled 2022-11-30: qty 30, 30d supply, fill #2
  Filled 2023-02-09: qty 30, 30d supply, fill #3

## 2022-09-19 ENCOUNTER — Telehealth: Payer: Self-pay | Admitting: Cardiology

## 2022-09-19 ENCOUNTER — Other Ambulatory Visit (HOSPITAL_COMMUNITY): Payer: Self-pay

## 2022-09-19 MED ORDER — LABETALOL HCL 200 MG PO TABS
400.0000 mg | ORAL_TABLET | Freq: Two times a day (BID) | ORAL | 5 refills | Status: DC
  Filled 2022-09-19: qty 60, 15d supply, fill #0
  Filled 2022-10-05: qty 60, 15d supply, fill #1
  Filled 2022-10-25: qty 60, 15d supply, fill #2
  Filled 2022-11-30: qty 60, 15d supply, fill #3
  Filled 2023-02-09: qty 60, 15d supply, fill #4

## 2022-09-19 NOTE — Telephone Encounter (Signed)
Patient advised of Dr. Terrial Rhodes response: "I have not approved short term disability for this patient. I have no clinical basis right now. She will need to work with her pcp or OBGYN."  Patient said, "Okay."

## 2022-09-19 NOTE — Telephone Encounter (Signed)
Patient states she is having her short term disability continued.  Patient states paperwork for extension is being faxed to the office from her employer.  Please return call when received/updates.

## 2022-09-19 NOTE — Telephone Encounter (Signed)
Pt is returning call and is requesting call back.  

## 2022-09-19 NOTE — Telephone Encounter (Signed)
*  STAT* If patient is at the pharmacy, call can be transferred to refill team.   1. Which medications need to be refilled? (please list name of each medication and dose if known) labetalol (NORMODYNE) 200 MG tablet  2. Which pharmacy/location (including street and city if local pharmacy) is medication to be sent to?  [place]   3. Do they need a 30 day or 90 day supply? 90   Pt ask that it be sent to [facility] instead of [facility]

## 2022-09-23 ENCOUNTER — Ambulatory Visit: Payer: 59 | Admitting: Cardiology

## 2022-09-26 ENCOUNTER — Encounter: Payer: Self-pay | Admitting: Cardiology

## 2022-09-27 ENCOUNTER — Ambulatory Visit (HOSPITAL_COMMUNITY): Payer: 59 | Attending: Cardiology

## 2022-09-27 DIAGNOSIS — O1003 Pre-existing essential hypertension complicating the puerperium: Secondary | ICD-10-CM | POA: Insufficient documentation

## 2022-09-27 DIAGNOSIS — I371 Nonrheumatic pulmonary valve insufficiency: Secondary | ICD-10-CM

## 2022-09-27 DIAGNOSIS — I5030 Unspecified diastolic (congestive) heart failure: Secondary | ICD-10-CM

## 2022-09-27 LAB — ECHOCARDIOGRAM COMPLETE
Area-P 1/2: 3.21 cm2
S' Lateral: 3.4 cm

## 2022-09-27 NOTE — Telephone Encounter (Signed)
Spoke to patient she stated her chest felt heavy earlier this morning lasted appox 10 min.Pulse has been 63 to 80.Blood pressure readings listed below.Stated she feels fine now.I will send message to Dr.Tobb for advice.

## 2022-10-03 ENCOUNTER — Telehealth: Payer: Self-pay | Admitting: Cardiology

## 2022-10-03 NOTE — Telephone Encounter (Signed)
*  STAT* If patient is at the pharmacy, call can be transferred to refill team.   1. Which medications need to be refilled? (please list name of each medication and dose if known)   hydrALAZINE (APRESOLINE) 50 MG tablet    2. Which pharmacy/location (including street and city if local pharmacy) is medication to be sent to?  [id number]- [place], [place] - [address] AT [address]   3. Do they need a 30 day or 90 day supply? [address]

## 2022-10-04 MED ORDER — HYDRALAZINE HCL 50 MG PO TABS
50.0000 mg | ORAL_TABLET | Freq: Three times a day (TID) | ORAL | 3 refills | Status: DC
  Filled 2022-10-26 – 2022-12-06 (×2): qty 90, 30d supply, fill #0

## 2022-10-04 NOTE — Telephone Encounter (Signed)
Follow Up:        Patient is calling back to see if medicine have been called in. She needs her medicine called in today- been out medicine for 2 days.

## 2022-10-05 ENCOUNTER — Other Ambulatory Visit (HOSPITAL_COMMUNITY): Payer: Self-pay

## 2022-10-12 DIAGNOSIS — E039 Hypothyroidism, unspecified: Secondary | ICD-10-CM | POA: Diagnosis not present

## 2022-10-25 ENCOUNTER — Other Ambulatory Visit (HOSPITAL_COMMUNITY): Payer: Self-pay

## 2022-10-26 ENCOUNTER — Other Ambulatory Visit (HOSPITAL_COMMUNITY): Payer: Self-pay

## 2022-10-27 ENCOUNTER — Other Ambulatory Visit (HOSPITAL_COMMUNITY): Payer: Self-pay

## 2022-10-28 ENCOUNTER — Other Ambulatory Visit (HOSPITAL_COMMUNITY): Payer: Self-pay

## 2022-11-04 ENCOUNTER — Other Ambulatory Visit (HOSPITAL_COMMUNITY): Payer: Self-pay

## 2022-12-06 ENCOUNTER — Other Ambulatory Visit (HOSPITAL_COMMUNITY): Payer: Self-pay

## 2022-12-15 ENCOUNTER — Telehealth: Payer: Self-pay | Admitting: Internal Medicine

## 2022-12-15 NOTE — Telephone Encounter (Signed)
Printed and placed up front .

## 2022-12-15 NOTE — Telephone Encounter (Signed)
Patient called and needs a copy of vaccine records. Please print and call patient when ready for pickup at [phone]

## 2022-12-16 ENCOUNTER — Encounter: Payer: Self-pay | Admitting: Cardiology

## 2022-12-16 ENCOUNTER — Ambulatory Visit: Payer: Commercial Managed Care - PPO | Attending: Cardiology | Admitting: Cardiology

## 2022-12-16 ENCOUNTER — Other Ambulatory Visit (HOSPITAL_COMMUNITY): Payer: Self-pay

## 2022-12-16 VITALS — BP 122/86 | HR 83 | Ht 65.0 in | Wt 225.2 lb

## 2022-12-16 DIAGNOSIS — Z8759 Personal history of other complications of pregnancy, childbirth and the puerperium: Secondary | ICD-10-CM

## 2022-12-16 DIAGNOSIS — O165 Unspecified maternal hypertension, complicating the puerperium: Secondary | ICD-10-CM

## 2022-12-16 DIAGNOSIS — E669 Obesity, unspecified: Secondary | ICD-10-CM | POA: Diagnosis not present

## 2022-12-16 MED ORDER — HYDROCHLOROTHIAZIDE 12.5 MG PO CAPS
12.5000 mg | ORAL_CAPSULE | Freq: Every day | ORAL | 3 refills | Status: DC
  Filled 2022-12-16: qty 90, 90d supply, fill #0

## 2022-12-16 MED ORDER — HYDRALAZINE HCL 25 MG PO TABS
25.0000 mg | ORAL_TABLET | Freq: Two times a day (BID) | ORAL | 3 refills | Status: DC
  Filled 2022-12-16: qty 180, 90d supply, fill #0

## 2022-12-16 NOTE — Progress Notes (Signed)
Cardio-Obstetrics Clinic  Follow Up Note   Date:  12/17/2022   ID:  Brandi Wise, DOB [birth year], MRN [id number]  PCP:  Hoyt Koch, MD   [place] Prov[facility] Cardiologist:  Berniece Salines, DO  Electrophysiologist:  None        Referring MD: Hoyt Koch, *   Chief Complaint: " I am doing ok"  History of Present Illness:    Brandi Wise is a 32 y.o. female [G3P1021] who returns for follow up of postpartum hypertension.  Medical history includes prediabetes, preeclampsia and obesity.  At her last visit on September 12, 2022 at that time she was hypertensive and started the patient on amlodipine 5 mg daily, continue hydralazine as well as her labetalol.  Since her visit she is no longer breast-feeding.  Her blood pressure has improved.  However she had a transient elevation when he restarted work but this has improved significantly.  No other complaints at this time.   Prior CV Studies Reviewed: The following studies were reviewed today: TTE 20-Oct-2022 IMPRESSIONS     1. Left ventricular ejection fraction, by estimation, is 60 to 65%. The  left ventricle has normal function. The left ventricle has no regional  wall motion abnormalities. Left ventricular diastolic parameters are  consistent with Grade I diastolic  dysfunction (impaired relaxation). The average left ventricular global  longitudinal strain is -26.7 %. The global longitudinal strain is normal.   2. Right ventricular systolic function is normal. The right ventricular  size is normal. Tricuspid regurgitation signal is inadequate for assessing  PA pressure.   3. The mitral valve is abnormal. Trivial mitral valve regurgitation.   4. The aortic valve is tricuspid. Aortic valve regurgitation is not  visualized.   5. The inferior vena cava is normal in size with greater than 50%  respiratory variability, suggesting right atrial pressure of 3 mmHg.   Comparison(s): No prior  Echocardiogram.   FINDINGS   Left Ventricle: Left ventricular ejection fraction, by estimation, is 60  to 65%. The left ventricle has normal function. The left ventricle has no  regional wall motion abnormalities. The average left ventricular global  longitudinal strain is -26.7 %.  The global longitudinal strain is normal. The left ventricular internal  cavity size was normal in size. There is no left ventricular hypertrophy.  Left ventricular diastolic parameters are consistent with Grade I  diastolic dysfunction (impaired  relaxation). Indeterminate filling pressures.   Right Ventricle: The right ventricular size is normal. No increase in  right ventricular wall thickness. Right ventricular systolic function is  normal. Tricuspid regurgitation signal is inadequate for assessing PA  pressure.   Left Atrium: Left atrial size was normal in size.   Right Atrium: Right atrial size was normal in size.   Pericardium: There is no evidence of pericardial effusion.   Mitral Valve: The mitral valve is abnormal. There is mild thickening of  the anterior and posterior mitral valve leaflet(s). Trivial mitral valve  regurgitation.   Tricuspid Valve: The tricuspid valve is grossly normal. Tricuspid valve  regurgitation is trivial.   Aortic Valve: The aortic valve is tricuspid. Aortic valve regurgitation is  not visualized.   Pulmonic Valve: The pulmonic valve was grossly normal. Pulmonic valve  regurgitation is mild.   Aorta: The aortic root and ascending aorta are structurally normal, with  no evidence of dilitation.   Venous: The inferior vena cava is normal in size with greater than 50%  respiratory variability, suggesting right atrial pressure  of 3 mmHg.   IAS/Shunts: No atrial level shunt detected by color flow Doppler.   Past Medical History:  Diagnosis Date   Knee pain    Medical history non-contributory    Pre-diabetes    Preeclampsia in postpartum period 09/05/2022    Pregnancy induced hypertension     Past Surgical History:  Procedure Laterality Date   POLYPECTOMY     THYROIDECTOMY Left 05/19/2021   Procedure: THYROID LOBECTOMY;  Surgeon: Melida Quitter, MD;  Location: [facility] OR;  Service: ENT;  Laterality: Left;      OB History     Gravida  3   Para  1   Term  1   Preterm      AB  2   Living  1      SAB  2   IAB      Ectopic      Multiple  0   Live Births  1               Current Medications: Current Meds  Medication Sig   amLODipine (NORVASC) 5 MG tablet Take 1 tablet (5 mg total) by mouth daily.   ferrous sulfate 324 MG TBEC Take 1 tablet (324 mg total) by mouth 2 (two) times daily.   folic acid (FOLVITE) 1 MG tablet Take 1 tablet (1 mg total) by mouth daily.   hydrALAZINE (APRESOLINE) 25 MG tablet Take 1 tablet (25 mg total) by mouth in the morning and at bedtime.   hydrochlorothiazide (MICROZIDE) 12.5 MG capsule Take 1 capsule (12.5 mg total) by mouth daily.   ibuprofen (ADVIL) 600 MG tablet Take 1 tablet (600 mg total) by mouth every 6 (six) hours.   labetalol (NORMODYNE) 200 MG tablet Take 2 tablets (400 mg total) by mouth 2 (two) times daily.   levothyroxine (SYNTHROID) 75 MCG tablet Take 1 tablet (75 mcg total) by mouth daily.   mineral oil liquid 30 ml   Prenatal Vit-Fe Fumarate-FA (MULTIVITAMIN-PRENATAL) 27-0.8 MG TABS tablet Take 1 tablet by mouth daily at 12 noon.   [DISCONTINUED] hydrALAZINE (APRESOLINE) 50 MG tablet Take 1 tablet (50 mg total) by mouth every 8 (eight) hours.     Allergies:   Patient has no known allergies.   Social History   Socioeconomic History   Marital status: Single    Spouse name: Not on file   Number of children: Not on file   Years of education: Not on file   Highest education level: Not on file  Occupational History   Occupation: behavioral health/clinical social worker  Tobacco Use   Smoking status: Never   Smokeless tobacco: Never  Vaping Use   Vaping Use: Never used   Substance and Sexual Activity   Alcohol use: Not Currently    Comment: socially   Drug use: Never   Sexual activity: Not Currently  Other Topics Concern   Not on file  Social History Narrative   Right handed   Lives alone third fl apartment    Social Determinants of Health   Financial Resource Strain: Not on file  Food Insecurity: No Food Insecurity (09/05/2022)   Hunger Vital Sign    Worried About Running Out of Food in the Last Year: Never true    Ran Out of Food in the Last Year: Never true  Transportation Needs: No Transportation Needs (09/05/2022)   PRAPARE - Hydrologist (Medical): No    Lack of Transportation (Non-Medical): No  Physical Activity:  Not on file  Stress: Not on file  Social Connections: Not on file      Family History  Adopted: Yes  Problem Relation Age of Onset   Drug abuse Mother    Healthy Father       ROS:   Please see the history of present illness.     All other systems reviewed and are negative.   Labs/EKG Reviewed:    EKG:   EKG is was ordered today.    Recent Labs: 08/25/2022: Magnesium 4.2 09/05/2022: ALT 23; BUN 11; Creatinine, Ser 0.81; Hemoglobin 11.3; Platelets 645; Potassium 3.7; Sodium 137 09/15/2022: TSH 0.50   Recent Lipid Panel Lab Results  Component Value Date/Time   CHOL 160 12/25/2020 08:42 AM   TRIG 39 12/25/2020 08:42 AM   HDL 54 12/25/2020 08:42 AM   LDLCALC 97 12/25/2020 08:42 AM    Physical Exam:    VS:  BP 122/86   Pulse 83   Ht '5\' 5"'$  (1.651 m)   Wt 102.2 kg   SpO2 98%   BMI 37.48 kg/m     Wt Readings from Last 3 Encounters:  12/16/22 102.2 kg  09/12/22 113.7 kg  09/05/22 114.3 kg     GEN:  Well nourished, well developed in no acute distress HEENT: Normal NECK: No JVD; No carotid bruits LYMPHATICS: No lymphadenopathy CARDIAC: RRR, no murmurs, rubs, gallops RESPIRATORY:  Clear to auscultation without rales, wheezing or rhonchi  ABDOMEN: Soft, non-tender,  non-distended MUSCULOSKELETAL:  No edema; No deformity  SKIN: Warm and dry NEUROLOGIC:  Alert and oriented x 3 PSYCHIATRIC:  Normal affect    Risk Assessment/Risk Calculators:                 ASSESSMENT & PLAN:    Postpartum hypertension History of preeclampsia  Blood pressure acceptable in the office.  Continue amlodipine 5 mg daily, labetalol 400 mg twice daily.  Titrate down her hydralazine to 25 mg twice a day.  Will add hydrochlorothiazide 12.5 mg daily.  The patient understands the need to lose weight with diet and exercise. We have discussed specific strategies for this.  The patient is in agreement with the above plan. The patient left the office in stable condition.  The patient will follow up in 12 weeks.  Patient Instructions  Medication Instructions:  Your physician has recommended you make the following change in your medication:  START: Hydrochlorothiazide 12.5 mg daily DECREASE: Hydralazine 25 mg twice daily  *If you need a refill on your cardiac medications before your next appointment, please call your pharmacy*   Lab Work: None   Testing/Procedures: None   Follow-Up: At [facility], you and your health needs are our priority.  As part of our continuing mission to provide you with exceptional heart care, we have created designated Provider Care Teams.  These Care Teams include your primary Cardiologist (physician) and Advanced Practice Providers (APPs -  Physician Assistants and Nurse Practitioners) who all work together to provide you with the care you need, when you need it.  We recommend signing up for the patient portal called "MyChart".  Sign up information is provided on this After Visit Summary.  MyChart is used to connect with patients for Virtual Visits (Telemedicine).  Patients are able to view lab/test results, encounter notes, upcoming appointments, etc.  Non-urgent messages can be sent to your provider as well.   To learn more  about what you can do with MyChart, go to [email or web address].  Your next appointment:   12 week(s)  Provider:   Berniece Salines, DO     Other Instructions     Dispo:  No follow-ups on file.   Medication Adjustments/Labs and Tests Ordered: Current medicines are reviewed at length with the patient today.  Concerns regarding medicines are outlined above.  Tests Ordered: No orders of the defined types were placed in this encounter.  Medication Changes: Meds ordered this encounter  Medications   hydrochlorothiazide (MICROZIDE) 12.5 MG capsule    Sig: Take 1 capsule (12.5 mg total) by mouth daily.    Dispense:  90 capsule    Refill:  3   hydrALAZINE (APRESOLINE) 25 MG tablet    Sig: Take 1 tablet (25 mg total) by mouth in the morning and at bedtime.    Dispense:  180 tablet    Refill:  3

## 2022-12-16 NOTE — Telephone Encounter (Signed)
Patient called back and said she needed the other one that's through the [employer]. Please advise

## 2022-12-16 NOTE — Patient Instructions (Addendum)
Medication Instructions:  Your physician has recommended you make the following change in your medication:  START: Hydrochlorothiazide 12.5 mg daily DECREASE: Hydralazine 25 mg twice daily  *If you need a refill on your cardiac medications before your next appointment, please call your pharmacy*   Lab Work: None   Testing/Procedures: None   Follow-Up: At [facility], you and your health needs are our priority.  As part of our continuing mission to provide you with exceptional heart care, we have created designated Provider Care Teams.  These Care Teams include your primary Cardiologist (physician) and Advanced Practice Providers (APPs -  Physician Assistants and Nurse Practitioners) who all work together to provide you with the care you need, when you need it.  We recommend signing up for the patient portal called "MyChart".  Sign up information is provided on this After Visit Summary.  MyChart is used to connect with patients for Virtual Visits (Telemedicine).  Patients are able to view lab/test results, encounter notes, upcoming appointments, etc.  Non-urgent messages can be sent to your provider as well.   To learn more about what you can do with MyChart, go to [email or web address].    Your next appointment:   12 week(s)  Provider:   Berniece Salines, DO     Other Instructions

## 2022-12-16 NOTE — Telephone Encounter (Signed)
Called patient and let her know that this has been printed and placed up front for pick up

## 2023-02-10 ENCOUNTER — Other Ambulatory Visit (HOSPITAL_COMMUNITY): Payer: Self-pay

## 2023-03-09 DIAGNOSIS — Z01419 Encounter for gynecological examination (general) (routine) without abnormal findings: Secondary | ICD-10-CM | POA: Diagnosis not present

## 2023-03-09 DIAGNOSIS — I10 Essential (primary) hypertension: Secondary | ICD-10-CM | POA: Diagnosis not present

## 2023-03-09 DIAGNOSIS — E039 Hypothyroidism, unspecified: Secondary | ICD-10-CM | POA: Diagnosis not present

## 2023-04-03 ENCOUNTER — Ambulatory Visit: Payer: Commercial Managed Care - PPO | Admitting: Cardiology

## 2023-07-05 ENCOUNTER — Ambulatory Visit (INDEPENDENT_AMBULATORY_CARE_PROVIDER_SITE_OTHER): Payer: Commercial Managed Care - PPO | Admitting: Internal Medicine

## 2023-07-05 ENCOUNTER — Encounter: Payer: Self-pay | Admitting: Internal Medicine

## 2023-07-05 ENCOUNTER — Other Ambulatory Visit (HOSPITAL_COMMUNITY): Payer: Self-pay

## 2023-07-05 VITALS — BP 122/82 | HR 58 | Temp 98.5°F | Ht 65.0 in | Wt 200.0 lb

## 2023-07-05 DIAGNOSIS — I10 Essential (primary) hypertension: Secondary | ICD-10-CM

## 2023-07-05 DIAGNOSIS — R7303 Prediabetes: Secondary | ICD-10-CM

## 2023-07-05 DIAGNOSIS — E890 Postprocedural hypothyroidism: Secondary | ICD-10-CM | POA: Diagnosis not present

## 2023-07-05 DIAGNOSIS — Z0000 Encounter for general adult medical examination without abnormal findings: Secondary | ICD-10-CM | POA: Diagnosis not present

## 2023-07-05 LAB — COMPREHENSIVE METABOLIC PANEL
ALT: 13 U/L (ref 0–35)
AST: 19 U/L (ref 0–37)
Albumin: 4.1 g/dL (ref 3.5–5.2)
Alkaline Phosphatase: 56 U/L (ref 39–117)
BUN: 14 mg/dL (ref 6–23)
CO2: 26 mEq/L (ref 19–32)
Calcium: 8.8 mg/dL (ref 8.4–10.5)
Chloride: 104 mEq/L (ref 96–112)
Creatinine, Ser: 0.86 mg/dL (ref 0.40–1.20)
GFR: 88.06 mL/min (ref >60.00)
Glucose, Bld: 90 mg/dL (ref 70–99)
Potassium: 3.8 mEq/L (ref 3.5–5.1)
Sodium: 137 mEq/L (ref 135–145)
Total Bilirubin: 0.3 mg/dL (ref 0.2–1.2)
Total Protein: 7.7 g/dL (ref 6.0–8.3)

## 2023-07-05 LAB — CBC
HCT: 37.6 % (ref 36.0–46.0)
Hemoglobin: 12.2 g/dL (ref 12.0–15.0)
MCHC: 32.4 g/dL (ref 30.0–36.0)
MCV: 85.9 fl (ref 78.0–100.0)
Platelets: 502 10*3/uL — ABNORMAL HIGH (ref 150.0–400.0)
RBC: 4.38 Mil/uL (ref 3.87–5.11)
RDW: 14.4 % (ref 11.5–15.5)
WBC: 8.4 10*3/uL (ref 4.0–10.5)

## 2023-07-05 LAB — T4, FREE: Free T4: 0.78 ng/dL (ref 0.60–1.60)

## 2023-07-05 LAB — LIPID PANEL
Cholesterol: 155 mg/dL (ref 0–200)
HDL: 36.9 mg/dL — ABNORMAL LOW (ref >39.00)
LDL Cholesterol: 109 mg/dL — ABNORMAL HIGH (ref 0–99)
NonHDL: 117.75
Total CHOL/HDL Ratio: 4
Triglycerides: 45 mg/dL (ref 0.0–149.0)
VLDL: 9 mg/dL (ref 0.0–40.0)

## 2023-07-05 LAB — TSH: TSH: 1.65 u[IU]/mL (ref 0.35–5.50)

## 2023-07-05 LAB — HEMOGLOBIN A1C: Hgb A1c MFr Bld: 5.9 % (ref 4.6–6.5)

## 2023-07-05 MED ORDER — LEVOTHYROXINE SODIUM 75 MCG PO TABS
75.0000 ug | ORAL_TABLET | Freq: Every day | ORAL | 3 refills | Status: AC
  Filled 2023-07-05: qty 90, 90d supply, fill #0

## 2023-07-05 NOTE — Assessment & Plan Note (Signed)
No BMP recheck since starting hydrochlorothiazide so done CMP today. She is taking amlodipine 5 mg daily and hydrochlorothiazide 12.5 mg daily and labetalol 400 mg BID. She is not taking hydralazine (which is still on list). BP at goal so hydralazine removed. Adjust as needed. Counseled that she will likely have more BP risk throughout her lifetime due to pre-eclampsia.

## 2023-07-05 NOTE — Progress Notes (Signed)
   Subjective:   Patient ID: Brandi Wise, female    DOB: Jun 03, 1991, 32 y.o.   MRN: [id number]  HPI The patient is here for physical.  PMH, [facility], social history reviewed and updated  Review of Systems  Constitutional: Negative.   HENT: Negative.    Eyes: Negative.   Respiratory:  Negative for cough, chest tightness and shortness of breath.   Cardiovascular:  Negative for chest pain, palpitations and leg swelling.  Gastrointestinal:  Negative for abdominal distention, abdominal pain, constipation, diarrhea, nausea and vomiting.  Musculoskeletal: Negative.   Skin: Negative.   Neurological: Negative.   Psychiatric/Behavioral: Negative.      Objective:  Physical Exam Constitutional:      Appearance: She is well-developed.  HENT:     Head: Normocephalic and atraumatic.  Cardiovascular:     Rate and Rhythm: Normal rate and regular rhythm.  Pulmonary:     Effort: Pulmonary effort is normal. No respiratory distress.     Breath sounds: Normal breath sounds. No wheezing or rales.  Abdominal:     General: Bowel sounds are normal. There is no distension.     Palpations: Abdomen is soft.     Tenderness: There is no abdominal tenderness. There is no rebound.  Musculoskeletal:     Cervical back: Normal range of motion.  Skin:    General: Skin is warm and dry.  Neurological:     Mental Status: She is alert and oriented to person, place, and time.     Coordination: Coordination normal.     Vitals:   07/05/23 0952  BP: 122/82  Pulse: (!) 58  Temp: 98.5 F (36.9 C)  TempSrc: Oral  SpO2: 98%  Weight: 200 lb (90.7 kg)  Height: 5\' 5"  (1.651 m)    Assessment & Plan:

## 2023-07-05 NOTE — Patient Instructions (Signed)
We will check the labs today.    

## 2023-07-05 NOTE — Assessment & Plan Note (Signed)
Checking TSH and free T4 and adjust synthroid 75 mcg daily as needed.

## 2023-07-05 NOTE — Assessment & Plan Note (Signed)
Flu shot yearly. Tetanus up to date with gyn. Pap smear up to date with gyn. Counseled about sun safety and mole surveillance. Counseled about the dangers of distracted driving. Given 10 year screening recommendations.

## 2023-07-05 NOTE — Assessment & Plan Note (Signed)
Checking HgA1c, weight is down significantly.

## 2023-07-17 ENCOUNTER — Other Ambulatory Visit (HOSPITAL_COMMUNITY): Payer: Self-pay

## 2023-07-19 ENCOUNTER — Ambulatory Visit: Payer: Commercial Managed Care - PPO | Attending: Cardiology | Admitting: Cardiology

## 2023-09-18 ENCOUNTER — Ambulatory Visit (INDEPENDENT_AMBULATORY_CARE_PROVIDER_SITE_OTHER): Payer: Commercial Managed Care - PPO | Admitting: Internal Medicine

## 2023-09-18 ENCOUNTER — Other Ambulatory Visit: Payer: Self-pay

## 2023-09-18 ENCOUNTER — Encounter: Payer: Self-pay | Admitting: Internal Medicine

## 2023-09-18 ENCOUNTER — Other Ambulatory Visit (HOSPITAL_COMMUNITY): Payer: Self-pay

## 2023-09-18 VITALS — BP 140/100 | HR 64 | Temp 98.6°F | Ht 65.0 in | Wt 201.0 lb

## 2023-09-18 DIAGNOSIS — G43709 Chronic migraine without aura, not intractable, without status migrainosus: Secondary | ICD-10-CM

## 2023-09-18 MED ORDER — UBRELVY 50 MG PO TABS
50.0000 mg | ORAL_TABLET | Freq: Every day | ORAL | 2 refills | Status: AC | PRN
  Filled 2023-09-18 – 2023-10-04 (×2): qty 10, 30d supply, fill #0
  Filled 2023-10-04: qty 30, 30d supply, fill #0
  Filled 2023-10-05 – 2023-10-19 (×2): qty 16, 30d supply, fill #0

## 2023-09-18 MED ORDER — PREDNISONE 20 MG PO TABS
40.0000 mg | ORAL_TABLET | Freq: Every day | ORAL | 0 refills | Status: AC
  Filled 2023-09-18: qty 8, 4d supply, fill #0

## 2023-09-18 NOTE — Patient Instructions (Addendum)
We will get the MRI of the brain to check.   We have sent in prednisone to take 2 pills daily for 4 days to help the headaches.  We have sent in [address] which you can take as needed for a headache/migraine when you feel it starting. If needed you can take a second dose 2 hours later if headache does not improve.

## 2023-09-18 NOTE — Progress Notes (Signed)
   Subjective:   Patient ID: Brandi Wise, female    DOB: 05-25-1991, 33 y.o.   MRN: [id number]  HPI The patient is a 32 YO female coming in for migraines. BP normal at home. Happening daily for last several weeks. Before that less migraines and had been months. No change in diet, exercise, lifestyle in that time. No head trauma. She is having some blurriness of the vision as well. She is unsure last time eyes were checked.   Review of Systems  Constitutional: Negative.   HENT: Negative.    Eyes: Negative.   Respiratory:  Negative for cough, chest tightness and shortness of breath.   Cardiovascular:  Negative for chest pain, palpitations and leg swelling.  Gastrointestinal:  Negative for abdominal distention, abdominal pain, constipation, diarrhea, nausea and vomiting.  Musculoskeletal: Negative.   Skin: Negative.   Neurological:  Positive for headaches.  Psychiatric/Behavioral: Negative.      Objective:  Physical Exam Constitutional:      Appearance: She is well-developed.  HENT:     Head: Normocephalic and atraumatic.  Cardiovascular:     Rate and Rhythm: Normal rate and regular rhythm.  Pulmonary:     Effort: Pulmonary effort is normal. No respiratory distress.     Breath sounds: Normal breath sounds. No wheezing or rales.  Abdominal:     General: Bowel sounds are normal. There is no distension.     Palpations: Abdomen is soft.     Tenderness: There is no abdominal tenderness. There is no rebound.  Musculoskeletal:     Cervical back: Normal range of motion.  Skin:    General: Skin is warm and dry.  Neurological:     Mental Status: She is alert and oriented to person, place, and time.     Coordination: Coordination normal.     Vitals:   09/18/23 1046 09/18/23 1048  BP: (!) 140/100 (!) 140/100  Pulse: 64   Temp: 98.6 F (37 C)   TempSrc: Oral   SpO2: 99%   Weight: 201 lb (91.2 kg)   Height: 5\' 5"  (1.651 m)     Assessment & Plan:

## 2023-09-22 DIAGNOSIS — G43709 Chronic migraine without aura, not intractable, without status migrainosus: Secondary | ICD-10-CM | POA: Insufficient documentation

## 2023-09-22 NOTE — Assessment & Plan Note (Signed)
Previously very rare migraine every few months or so. Now daily for last 3 weeks with some vision changes. Rx prednisone 4 day course and ubrelvy for treatment of headaches. Ordered MRI brain to assess given change in pattern and frequency of migraines abruptly.

## 2023-09-27 ENCOUNTER — Other Ambulatory Visit (HOSPITAL_COMMUNITY): Payer: Self-pay

## 2023-10-03 ENCOUNTER — Ambulatory Visit
Admission: EM | Admit: 2023-10-03 | Discharge: 2023-10-03 | Disposition: A | Discharge status: Home / Self Care | Payer: Commercial Managed Care - PPO | Attending: Internal Medicine | Admitting: Internal Medicine

## 2023-10-03 DIAGNOSIS — J069 Acute upper respiratory infection, unspecified: Secondary | ICD-10-CM | POA: Diagnosis not present

## 2023-10-03 MED ORDER — CETIRIZINE HCL 10 MG PO TABS
10.0000 mg | ORAL_TABLET | Freq: Every day | ORAL | 0 refills | Status: AC
  Filled 2023-10-03: qty 30, 30d supply, fill #0

## 2023-10-03 MED ORDER — ACETAMINOPHEN 325 MG PO TABS
650.0000 mg | ORAL_TABLET | Freq: Four times a day (QID) | ORAL | 0 refills | Status: AC | PRN
  Filled 2023-10-03: qty 30, 4d supply, fill #0

## 2023-10-03 MED ORDER — PREDNISONE 10 MG PO TABS
30.0000 mg | ORAL_TABLET | Freq: Every day | ORAL | 0 refills | Status: DC
  Filled 2023-10-03: qty 15, 5d supply, fill #0

## 2023-10-03 MED ORDER — PROMETHAZINE-DM 6.25-15 MG/5ML PO SYRP
5.0000 mL | ORAL_SOLUTION | Freq: Three times a day (TID) | ORAL | 0 refills | Status: DC | PRN
  Filled 2023-10-03: qty 200, 14d supply, fill #0

## 2023-10-03 NOTE — ED Triage Notes (Signed)
Pt reports cough and congestion x 4 days. Theraflu and Tylenol cold gives no relief.

## 2023-10-03 NOTE — ED Provider Notes (Signed)
[facility]  Note:  This document was prepared using Conservation officer, historic buildings and may include unintentional dictation errors.  MRN: [id number] DOB: [birth year]  Subjective:   Brandi Wise is a 32 y.o. female presenting for 4-day history of acute onset sinus congestion, sinus drainage, coughing, malaise and fatigue.  Has been using TheraFlu and Tylenol without relief.  She has persistent or recurrent exposure to illness through her baby girl who goes to daycare.  She herself has similar symptoms and is being seen today.  No current facility-administered medications for this encounter.  Current Outpatient Medications:    amLODipine (NORVASC) 5 MG tablet, Take 1 tablet (5 mg total) by mouth daily., Disp: 90 tablet, Rfl: 2   ferrous sulfate 324 MG TBEC, Take 1 tablet (324 mg total) by mouth 2 (two) times daily., Disp: 60 tablet, Rfl: 11   folic acid (FOLVITE) 1 MG tablet, Take 1 tablet (1 mg total) by mouth daily., Disp: 90 tablet, Rfl: 2   ibuprofen (ADVIL) 600 MG tablet, Take 1 tablet (600 mg total) by mouth every 6 (six) hours., Disp: 30 tablet, Rfl: 11   labetalol (NORMODYNE) 200 MG tablet, Take 2 tablets (400 mg total) by mouth 2 (two) times daily., Disp: 60 tablet, Rfl: 5   levothyroxine (SYNTHROID) 75 MCG tablet, Take 1 tablet (75 mcg total) by mouth daily., Disp: 90 tablet, Rfl: 3   mineral oil liquid, 30 ml, Disp: 180 mL, Rfl: 0   Prenatal Vit-Fe Fumarate-FA (MULTIVITAMIN-PRENATAL) 27-0.8 MG TABS tablet, Take 1 tablet by mouth daily at 12 noon., Disp: , Rfl:    Ubrogepant (UBRELVY) 50 MG TABS, Take 1 tablet (50 mg total) by mouth daily as needed., Disp: 30 tablet, Rfl: 2   No Known Allergies  Past Medical History:  Diagnosis Date   Knee pain    Medical history non-contributory    Pre-diabetes    Preeclampsia in postpartum period 09/05/2022   Pregnancy induced hypertension      Past Surgical History:  Procedure Laterality Date   POLYPECTOMY      THYROIDECTOMY Left 05/19/2021   Procedure: THYROID LOBECTOMY;  Surgeon: Christia Reading, MD;  Location: [facility] OR;  Service: ENT;  Laterality: Left;    Family History  Adopted: Yes  Problem Relation Age of Onset   Drug abuse Mother    Healthy Father     Social History   Tobacco Use   Smoking status: Never   Smokeless tobacco: Never  Vaping Use   Vaping status: Never Used  Substance Use Topics   Alcohol use: Yes    Comment: socially   Drug use: Never    ROS   Objective:   Vitals: BP (!) 143/92 (BP Location: Left Arm)   Pulse 68   Temp 98 F (36.7 C) (Oral)   Resp 18   LMP  (Within Weeks) Comment: 2 weeks  SpO2 98%   Breastfeeding No   Physical Exam Constitutional:      General: She is not in acute distress.    Appearance: Normal appearance. She is well-developed and normal weight. She is not ill-appearing, toxic-appearing or diaphoretic.  HENT:     Head: Normocephalic and atraumatic.     Right Ear: Tympanic membrane, ear canal and external ear normal. No drainage or tenderness. No middle ear effusion. There is no impacted cerumen. Tympanic membrane is not erythematous or bulging.     Left Ear: Tympanic membrane, ear canal and external ear normal. No drainage or tenderness.  No middle ear effusion. There is no impacted cerumen. Tympanic membrane is not erythematous or bulging.     Nose: Congestion and rhinorrhea present.     Mouth/Throat:     Mouth: Mucous membranes are moist. No oral lesions.     Pharynx: No pharyngeal swelling, oropharyngeal exudate, posterior oropharyngeal erythema or uvula swelling.     Tonsils: No tonsillar exudate or tonsillar abscesses.  Eyes:     General: No scleral icterus.       Right eye: No discharge.        Left eye: No discharge.     Extraocular Movements: Extraocular movements intact.     Right eye: Normal extraocular motion.     Left eye: Normal extraocular motion.     Conjunctiva/sclera: Conjunctivae normal.  Cardiovascular:      Rate and Rhythm: Normal rate and regular rhythm.     Heart sounds: Normal heart sounds. No murmur heard.    No friction rub. No gallop.  Pulmonary:     Effort: Pulmonary effort is normal. No respiratory distress.     Breath sounds: No stridor. No wheezing, rhonchi or rales.  Chest:     Chest wall: No tenderness.  Musculoskeletal:     Cervical back: Normal range of motion and neck supple.  Lymphadenopathy:     Cervical: No cervical adenopathy.  Skin:    General: Skin is warm and dry.  Neurological:     General: No focal deficit present.     Mental Status: She is alert and oriented to person, place, and time.  Psychiatric:        Mood and Affect: Mood normal.        Behavior: Behavior normal.     Assessment and Plan :   PDMP not reviewed this encounter.  1. Viral upper respiratory infection    Patient's mother requested aggressive management and therefore I offered lower dose prednisone as she does not have a history of asthma, is not a smoker.  Recommend supportive care otherwise for viral upper respiratory infection.  Discussed antibiotic stewardship and patient was agreeable.  Counseled patient on potential for adverse effects with medications prescribed/recommended today, ER and return-to-clinic precautions discussed, patient verbalized understanding.    Wallis Bamberg, [place] 10/03/23 1931

## 2023-10-04 ENCOUNTER — Other Ambulatory Visit (HOSPITAL_COMMUNITY): Payer: Self-pay

## 2023-10-05 ENCOUNTER — Other Ambulatory Visit (HOSPITAL_COMMUNITY): Payer: Self-pay

## 2023-10-05 ENCOUNTER — Telehealth: Payer: Self-pay

## 2023-10-05 NOTE — Telephone Encounter (Signed)
Pharmacy Patient Advocate Encounter   Received notification from CoverMyMeds that prior authorization for Ubrelvy 50MG  tablets is required/requested.   Insurance verification completed.   The patient is insured through [facility] .   Per test claim: PA required; PA submitted to above mentioned insurance via CoverMyMeds Key/confirmation #/EOC [id number] Status is pending

## 2023-10-06 ENCOUNTER — Ambulatory Visit
Admission: RE | Admit: 2023-10-06 | Discharge: 2023-10-06 | Disposition: A | Discharge status: Home / Self Care | Payer: Commercial Managed Care - PPO | Source: Ambulatory Visit | Attending: Internal Medicine | Admitting: Internal Medicine

## 2023-10-06 DIAGNOSIS — R110 Nausea: Secondary | ICD-10-CM | POA: Diagnosis not present

## 2023-10-06 DIAGNOSIS — G43709 Chronic migraine without aura, not intractable, without status migrainosus: Secondary | ICD-10-CM | POA: Diagnosis not present

## 2023-10-09 ENCOUNTER — Telehealth: Payer: Self-pay | Admitting: Internal Medicine

## 2023-10-09 NOTE — Telephone Encounter (Signed)
Patient returned Micaiah's call about her results. She would like a call back at [phone].

## 2023-10-09 NOTE — Telephone Encounter (Signed)
Please call patient back

## 2023-10-09 NOTE — Telephone Encounter (Signed)
Pharmacy Patient Advocate Encounter  Received notification from [facility] that Prior Authorization for UBRELVY has been APPROVED from 10/05/23 to 04/02/24   PA #/Case ID/Reference #: [id number]

## 2023-10-10 NOTE — Telephone Encounter (Signed)
Spoke with patient regarding her labs.

## 2023-10-17 ENCOUNTER — Other Ambulatory Visit (HOSPITAL_COMMUNITY): Payer: Self-pay

## 2023-10-19 ENCOUNTER — Other Ambulatory Visit (HOSPITAL_COMMUNITY): Payer: Self-pay

## 2023-10-30 ENCOUNTER — Other Ambulatory Visit (HOSPITAL_COMMUNITY): Payer: Self-pay

## 2024-02-06 NOTE — Progress Notes (Unsigned)
 Cardiology Office Note    Date:  02/07/2024  ID:  Brandi Wise, DOB 1991-09-05, MRN [id number] PCP:  Brandi Broker, MD  Cardiologist:  Brandi Ripple, DO  Electrophysiologist:  None   Chief Complaint: Hypertension   History of Present Illness: .    Brandi Wise is a 33 y.o. female [phone]) with visit-pertinent history of postpartum hypertension, prediabetes, preeclampsia and obesity.  First evaluated by Dr. Marlowe Wise on 09/12/2022 after being admitted to [facility] day 2 with preeclampsia.  She had been discharged on amlodipine 2.5 mg daily, hydralazine 50 mg every 8 hours and labetalol 400 mg twice a day.  She was last seen in clinic on 12/16/2022, her amlodipine had previously been increased to 5 mg daily.  Her hydralazine was decreased to 25 mg twice a day and she was started on hydrochlorothiazide 12.5 mg daily.  She was continued on amlodipine 5 mg daily and and labetalol 400 mg twice daily.  It was recommended that she follow-up in 12 weeks.  Today she presents for follow-up. She reports that she has been doing okay.  She does note some labile blood pressures at home.  She reports that she stopped taking her amlodipine and labetalol in August.  The last few weeks that she has noted labile blood pressures ranging from 129/118-152/117.  She notes that she was at [employer] doing handstand push-ups and her blood pressure spiked, she became dizzy and had a slight headache.  She also noted a fluttering sensation in her chest with some mild shortness of breath and discomfort that quickly resolved.  She is also noted nearly daily fluttering sensation in her chest since mid February that can last from 5 minutes to 30 minutes.  She notes with prolonged episodes she will need to sit down she feels slightly dizzy, improves when sitting.  She denies any presyncope, syncope, lower extremity edema, orthopnea or PND.  She reports that she is not currently pregnant, last LMP 2 weeks ago  when she is not breast-feeding currently.  Labwork independently reviewed: 07/05/2023: Sodium 137, potassium 3.8, creatinine 0.86, AST 19, ALT 13  ROS: .   Today she denies lower extremity edema, fatigue, melena, hematuria, hemoptysis, diaphoresis, weakness, presyncope, syncope, orthopnea, and PND.  All other systems are reviewed and otherwise negative. Studies Reviewed: Marland Kitchen    EKG:  EKG is ordered today, personally reviewed, demonstrating  EKG Interpretation Date/Time:  Wednesday February 07 2024 11:14:02 EDT Ventricular Rate:  67 PR Interval:  158 QRS Duration:  80 QT Interval:  414 QTC Calculation: 437 R Axis:   69  Text Interpretation: Normal sinus rhythm Normal ECG Confirmed by Brandi Wise [phone]) on 02/07/2024 11:47:38 AM   CV Studies: Cardiac studies reviewed are outlined and summarized above. Otherwise please see EMR for full report. Cardiac Studies & Procedures   ______________________________________________________________________________________________     ECHOCARDIOGRAM  ECHOCARDIOGRAM COMPLETE 09/27/2022  Narrative ECHOCARDIOGRAM REPORT    Patient Name:   Brandi Wise Date of Exam: 09/27/2022 Medical Rec #:  [id number]      Height:       65.0 in Accession #:    [id number]     Weight:       250.6 lb Date of Birth:  1990/12/16      BSA:          2.177 m Patient Age:    31 years       BP:           100/76 mmHg  Patient Gender: F              HR:           74 bpm. Exam Location:  [address]  Procedure: 2D Echo, 3D Echo, Cardiac Doppler, Color Doppler and Strain Analysis  Indications:    I10 Hypertension  History:        Patient has no prior history of Echocardiogram examinations. Risk Factors:Pre-diabetes.  Sonographer:    Brandi Wise RDCS Referring Phys: [id number] [facility]  IMPRESSIONS   1. Left ventricular ejection fraction, by estimation, is 60 to 65%. The left ventricle has normal function. The left ventricle has no regional wall motion  abnormalities. Left ventricular diastolic parameters are consistent with Grade I diastolic dysfunction (impaired relaxation). The average left ventricular global longitudinal strain is -26.7 %. The global longitudinal strain is normal. 2. Right ventricular systolic function is normal. The right ventricular size is normal. Tricuspid regurgitation signal is inadequate for assessing PA pressure. 3. The mitral valve is abnormal. Trivial mitral valve regurgitation. 4. The aortic valve is tricuspid. Aortic valve regurgitation is not visualized. 5. The inferior vena cava is normal in size with greater than 50% respiratory variability, suggesting right atrial pressure of 3 mmHg.  Comparison(s): No prior Echocardiogram.  FINDINGS Left Ventricle: Left ventricular ejection fraction, by estimation, is 60 to 65%. The left ventricle has normal function. The left ventricle has no regional wall motion abnormalities. The average left ventricular global longitudinal strain is -26.7 %. The global longitudinal strain is normal. The left ventricular internal cavity size was normal in size. There is no left ventricular hypertrophy. Left ventricular diastolic parameters are consistent with Grade I diastolic dysfunction (impaired relaxation). Indeterminate filling pressures.  Right Ventricle: The right ventricular size is normal. No increase in right ventricular wall thickness. Right ventricular systolic function is normal. Tricuspid regurgitation signal is inadequate for assessing PA pressure.  Left Atrium: Left atrial size was normal in size.  Right Atrium: Right atrial size was normal in size.  Pericardium: There is no evidence of pericardial effusion.  Mitral Valve: The mitral valve is abnormal. There is mild thickening of the anterior and posterior mitral valve leaflet(s). Trivial mitral valve regurgitation.  Tricuspid Valve: The tricuspid valve is grossly normal. Tricuspid valve regurgitation is  trivial.  Aortic Valve: The aortic valve is tricuspid. Aortic valve regurgitation is not visualized.  Pulmonic Valve: The pulmonic valve was grossly normal. Pulmonic valve regurgitation is mild.  Aorta: The aortic root and ascending aorta are structurally normal, with no evidence of dilitation.  Venous: The inferior vena cava is normal in size with greater than 50% respiratory variability, suggesting right atrial pressure of 3 mmHg.  IAS/Shunts: No atrial level shunt detected by color flow Doppler.   LEFT VENTRICLE PLAX 2D LVIDd:         5.20 cm   Diastology LVIDs:         3.40 cm   LV e' medial:    6.20 cm/s LV PW:         0.80 cm   LV E/e' medial:  14.0 LV IVS:        0.80 cm   LV e' lateral:   7.04 cm/s LVOT diam:     2.00 cm   LV E/e' lateral: 12.4 LV SV:         64 LV SV Index:   29        2D Longitudinal Strain LVOT Area:     3.14 cm  2D Strain GLS (A2C):   -25.4 % 2D Strain GLS (A3C):   -28.3 % 2D Strain GLS (A4C):   -26.5 % 2D Strain GLS Avg:     -26.7 %  3D Volume EF: 3D EF:        57 % LV EDV:       167 ml LV ESV:       71 ml LV SV:        96 ml  RIGHT VENTRICLE             IVC RV Basal diam:  3.70 cm     IVC diam: 1.30 cm RV Mid diam:    2.80 cm RV S prime:     11.65 cm/s TAPSE (M-mode): 2.5 cm  LEFT ATRIUM           Index        RIGHT ATRIUM           Index LA diam:      3.90 cm 1.79 cm/m   RA Area:     10.50 cm LA Vol (A2C): 44.4 ml 20.39 ml/m  RA Volume:   23.10 ml  10.61 ml/m LA Vol (A4C): 55.2 ml 25.35 ml/m AORTIC VALVE LVOT Vmax:   105.00 cm/s LVOT Vmean:  68.600 cm/s LVOT VTI:    0.204 m  AORTA Ao Root diam: 3.00 cm Ao Asc diam:  3.40 cm  MITRAL VALVE MV Area (PHT): 3.21 cm    SHUNTS MV Decel Time: 236 msec    Systemic VTI:  0.20 m MV E velocity: 87.00 cm/s  Systemic Diam: 2.00 cm MV A velocity: 84.90 cm/s MV E/A ratio:  1.02  Zoila Shutter MD Electronically signed by Zoila Shutter MD Signature Date/Time: 09/27/2022/12:41:17  PM    Final          ______________________________________________________________________________________________       Current Reported Medications:.    Current Meds  Medication Sig   levothyroxine (SYNTHROID) 75 MCG tablet Take 1 tablet (75 mcg total) by mouth daily.   Ubrogepant (UBRELVY) 50 MG TABS Take 1 tablet (50 mg total) by mouth daily as needed.   Physical Exam:    VS:  BP 126/82   Pulse 67   Ht 5\' 5"  (1.651 m)   Wt 205 lb (93 kg)   SpO2 97%   BMI 34.11 kg/m    Wt Readings from Last 3 Encounters:  02/07/24 205 lb (93 kg)  09/18/23 201 lb (91.2 kg)  07/05/23 200 lb (90.7 kg)    GEN: Well nourished, well developed in no acute distress NECK: No JVD; No carotid bruits CARDIAC: RRR, no murmurs, rubs, gallops RESPIRATORY:  Clear to auscultation without rales, wheezing or rhonchi  ABDOMEN: Soft, non-tender, non-distended EXTREMITIES:  No edema; No acute deformity     Asessement and Plan:Marland Kitchen    Hypertension: Initial blood pressure today 120/90, on recheck was 126/82.  Patient reports labile blood pressures at home with readings ranging from 129/118-152/117.  She reports that she stopped her labetalol and amlodipine in August.  Will have patient restart her amlodipine at 5 mg daily.  Encouraged to monitor her blood pressure at home and bring record to follow-up.  Prediabetes: Last hemoglobin A1c 5.9 on 07/05/2023.  Monitored and managed per PCP.  Patient is regularly exercising, doing [employer].  Palpitations: Patient reports daily fluttering sensation in her chest lasting from 5 to 30 minutes.  She reports with shorter episodes she has no associated symptoms, if lasting closer  to 30 minutes will have some slight dizziness that improves when she sits down.  She reports the second happening almost daily since mid February.  Patient was previously on labetalol, stopped taking medication and August.  Will have patient wear 1 week ZIO monitor.  Encouraged patient to stay  well-hydrated and decrease her caffeine intake.  Check CBC, be met, and TSH.    Disposition: F/u with Brandi Littler, NP in 5-6 weeks.   Signed, Rip Harbour, NP

## 2024-02-07 ENCOUNTER — Ambulatory Visit: Payer: Commercial Managed Care - PPO | Attending: Cardiology | Admitting: Cardiology

## 2024-02-07 ENCOUNTER — Other Ambulatory Visit (HOSPITAL_COMMUNITY): Payer: Self-pay

## 2024-02-07 ENCOUNTER — Ambulatory Visit (INDEPENDENT_AMBULATORY_CARE_PROVIDER_SITE_OTHER)

## 2024-02-07 ENCOUNTER — Encounter: Payer: Self-pay | Admitting: Cardiology

## 2024-02-07 VITALS — BP 126/82 | HR 67 | Ht 65.0 in | Wt 205.0 lb

## 2024-02-07 DIAGNOSIS — O1003 Pre-existing essential hypertension complicating the puerperium: Secondary | ICD-10-CM

## 2024-02-07 DIAGNOSIS — R002 Palpitations: Secondary | ICD-10-CM

## 2024-02-07 DIAGNOSIS — R7303 Prediabetes: Secondary | ICD-10-CM | POA: Diagnosis not present

## 2024-02-07 DIAGNOSIS — E669 Obesity, unspecified: Secondary | ICD-10-CM

## 2024-02-07 DIAGNOSIS — O1493 Unspecified pre-eclampsia, third trimester: Secondary | ICD-10-CM

## 2024-02-07 DIAGNOSIS — Z8759 Personal history of other complications of pregnancy, childbirth and the puerperium: Secondary | ICD-10-CM

## 2024-02-07 MED ORDER — AMLODIPINE BESYLATE 5 MG PO TABS
5.0000 mg | ORAL_TABLET | Freq: Every day | ORAL | 3 refills | Status: AC
  Filled 2024-02-07: qty 90, 90d supply, fill #0

## 2024-02-07 NOTE — Progress Notes (Unsigned)
 Enrolled for [employer] to mail a ZIO XT long term holter monitor to the patients address on file.   Dr. Harriet Masson to read.

## 2024-02-07 NOTE — Patient Instructions (Addendum)
 Medication Instructions:  Restart Amlodipine 5 mg once a day  *If you need a refill on your cardiac medications before your next appointment, please call your pharmacy*  Lab Work: Today we are going to draw a CBC, Bmet, TSH, and Mag If you have labs (blood work) drawn today and your tests are completely normal, you will receive your results only by: MyChart Message (if you have MyChart) OR A paper copy in the mail If you have any lab test that is abnormal or we need to change your treatment, we will call you to review the results.  Testing/Procedures: Christena Deem- Long Term Monitor Instructions  Your physician has requested you wear a ZIO patch monitor for 7 days.  This is a single patch monitor. [employer] supplies one patch monitor per enrollment. Additional stickers are not available. Please do not apply patch if you will be having a Nuclear Stress Test,  Echocardiogram, Cardiac CT, MRI, or Chest Xray during the period you would be wearing the  monitor. The patch cannot be worn during these tests. You cannot remove and re-apply the  ZIO XT patch monitor.  Your ZIO patch monitor will be mailed 3 day USPS to your address on file. It may take 3-5 days  to receive your monitor after you have been enrolled.  Once you have received your monitor, please review the enclosed instructions. Your monitor  has already been registered assigning a specific monitor serial # to you.  Billing and Patient Assistance Program Information  We have supplied [employer] with any of your insurance information on file for billing purposes. [employer] offers a sliding scale Patient Assistance Program for patients that do not have  insurance, or whose insurance does not completely cover the cost of the ZIO monitor.  You must apply for the Patient Assistance Program to qualify for this discounted rate.  To apply, please call [employer] at [phone], select option 4, select option 2, ask to apply for  Patient Assistance  Program. Meredeth Ide will ask your household income, and how many people  are in your household. They will quote your out-of-pocket cost based on that information.  [employer] will also be able to set up a 42-month, interest-free payment plan if needed.  Applying the monitor   Shave hair from upper left chest.  Hold abrader disc by orange tab. Rub abrader in 40 strokes over the upper left chest as  indicated in your monitor instructions.  Clean area with 4 enclosed alcohol pads. Let dry.  Apply patch as indicated in monitor instructions. Patch will be placed under collarbone on left  side of chest with arrow pointing upward.  Rub patch adhesive wings for 2 minutes. Remove white label marked "1". Remove the white  label marked "2". Rub patch adhesive wings for 2 additional minutes.  While looking in a mirror, press and release button in center of patch. A small green light will  flash 3-4 times. This will be your only indicator that the monitor has been turned on.  Do not shower for the first 24 hours. You may shower after the first 24 hours.  Press the button if you feel a symptom. You will hear a small click. Record Date, Time and  Symptom in the Patient Logbook.  When you are ready to remove the patch, follow instructions on the last 2 pages of Patient  Logbook. Stick patch monitor onto the last page of Patient Logbook.  Place Patient Logbook in the blue and white box. Use locking  tab on box and tape box closed  securely. The blue and white box has prepaid postage on it. Please place it in the mailbox as  soon as possible. Your physician should have your test results approximately 7 days after the  monitor has been mailed back to [facility].  Call [facility] Customer Care at [phone] if you have questions regarding  your ZIO XT patch monitor. Call them immediately if you see an orange light blinking on your  monitor.  If your monitor falls off in less than 4 days, contact our  Monitor department at [phone].  If your monitor becomes loose or falls off after 4 days call [employer] at [phone] for  suggestions on securing your monitor   Follow-Up: At [facility], you and your health needs are our priority.  As part of our continuing mission to provide you with exceptional heart care, we have created designated Provider Care Teams.  These Care Teams include your primary Cardiologist (physician) and Advanced Practice Providers (APPs -  Physician Assistants and Nurse Practitioners) who all work together to provide you with the care you need, when you need it.  We recommend signing up for the patient portal called "MyChart".  Sign up information is provided on this After Visit Summary.  MyChart is used to connect with patients for Virtual Visits (Telemedicine).  Patients are able to view lab/test results, encounter notes, upcoming appointments, etc.  Non-urgent messages can be sent to your provider as well.   To learn more about what you can do with MyChart, go to [email or web address].    Your next appointment:   5-6 week(s)  Provider:   Reather Littler, NP      Other Instructions Please take your blood pressure daily for 2 weeks and send in a MyChart message. Please include heart rates. (One message at the end of the 2 weeks).   HOW TO TAKE YOUR BLOOD PRESSURE: Rest 5 minutes before taking your blood pressure. Don't smoke or drink caffeinated beverages for at least 30 minutes before. Take your blood pressure before (not after) you eat. Sit comfortably with your back supported and both feet on the floor (don't cross your legs). Elevate your arm to heart level on a table or a desk. Use the proper sized cuff. It should fit smoothly and snugly around your bare upper arm. There should be enough room to slip a fingertip under the cuff. The bottom edge of the cuff should be 1 inch above the crease of the elbow. Ideally, take 3 measurements at one sitting and  record the average.

## 2024-02-08 LAB — CBC
Hematocrit: 36.2 % (ref 34.0–46.6)
Hemoglobin: 11.8 g/dL (ref 11.1–15.9)
MCH: 28.9 pg (ref 26.6–33.0)
MCHC: 32.6 g/dL (ref 31.5–35.7)
MCV: 89 fL (ref 79–97)
Platelets: 421 10*3/uL (ref 150–450)
RBC: 4.09 x10E6/uL (ref 3.77–5.28)
RDW: 12.9 % (ref 11.7–15.4)
WBC: 8.2 10*3/uL (ref 3.4–10.8)

## 2024-02-08 LAB — BASIC METABOLIC PANEL
BUN/Creatinine Ratio: 19 (ref 9–23)
BUN: 15 mg/dL (ref 6–20)
CO2: 23 mmol/L (ref 20–29)
Calcium: 8.9 mg/dL (ref 8.7–10.2)
Chloride: 106 mmol/L (ref 96–106)
Creatinine, Ser: 0.79 mg/dL (ref 0.57–1.00)
Glucose: 83 mg/dL (ref 70–99)
Potassium: 4.5 mmol/L (ref 3.5–5.2)
Sodium: 139 mmol/L (ref 134–144)
eGFR: 100 mL/min/{1.73_m2} (ref >59)

## 2024-02-08 LAB — TSH: TSH: 1.83 u[IU]/mL (ref 0.450–4.500)

## 2024-03-11 NOTE — Progress Notes (Unsigned)
 Cardiology Office Note    Date:  03/13/2024  ID:  Brandi Wise, DOB 07-29-1991, MRN [id number] PCP:  Myrlene Broker, MD  Cardiologist:  Thomasene Ripple, DO  Electrophysiologist:  None   Chief Complaint: Follow up for palpitations and hypertension   History of Present Illness: .    Brandi Wise is a 33 y.o. female with visit-pertinent history of postpartum hypertension, prediabetes, preeclampsia and obesity.  First evaluated by Dr. Servando Salina on 09/12/2022 after being admitted to [facility] postpartum day 2 with preeclampsia.  She had been discharged on amlodipine 2.5 mg daily, hydralazine 50 mg every 8 hours and labetalol 400 mg twice a day.  She was last seen in clinic on 12/16/2022, her amlodipine had previously been increased to 5 mg daily.  Her hydralazine was decreased to 25 mg twice a day and she was started on hydrochlorothiazide 12.5 mg daily.  She was continued on amlodipine 5 mg daily and and labetalol 400 mg twice daily.  It was recommended that she follow-up in 12 weeks.  Patient was last in clinic on 02/07/2024.  She noted labile blood pressures at home, she reported that she had stopped taking amlodipine and labetalol in August.  Noted that she was regularly doing CrossFit and would have spikes in blood pressure, she would become dizzy and had a slight headache.  She also reported a fluttering sensation in her chest with some mild shortness of breath and discomfort that quickly resolved.  Today she presents for follow-up.  She reports that she is doing well overall.  She notes that in the last week her chest discomfort and palpitations have improved.  She also notes that her dizziness and lightheadedness sickness improves.  She continues to do CrossFit regularly and is tolerating well.  She reports that her blood pressures at home have been between 130-140 systolic with diastolics in the 90s.  ROS: .   Today she denies chest pain, shortness of breath, lower extremity  edema, fatigue, palpitations, melena, hematuria, hemoptysis, diaphoresis, weakness, presyncope, syncope, orthopnea, and PND.  All other systems are reviewed and otherwise negative. Studies Reviewed: Marland Kitchen   EKG:  EKG is not ordered today.  CV Studies: Cardiac studies reviewed are outlined and summarized above. Otherwise please see EMR for full report. Cardiac Studies & Procedures   ______________________________________________________________________________________________     ECHOCARDIOGRAM  ECHOCARDIOGRAM COMPLETE 09/27/2022  Narrative ECHOCARDIOGRAM REPORT    Patient Name:   Brandi Wise Date of Exam: 09/27/2022 Medical Rec #:  [id number]      Height:       65.0 in Accession #:    [id number]     Weight:       250.6 lb Date of Birth:  1991/05/01      BSA:          2.177 m Patient Age:    31 years       BP:           100/76 mmHg Patient Gender: F              HR:           74 bpm. Exam Location:  [address]  Procedure: 2D Echo, 3D Echo, Cardiac Doppler, Color Doppler and Strain Analysis  Indications:    I10 Hypertension  History:        Patient has no prior history of Echocardiogram examinations. Risk Factors:Pre-diabetes.  Sonographer:    Daphine Deutscher RDCS Referring Phys: [id number] KARDIE TOBB  IMPRESSIONS  1. Left ventricular ejection fraction, by estimation, is 60 to 65%. The left ventricle has normal function. The left ventricle has no regional wall motion abnormalities. Left ventricular diastolic parameters are consistent with Grade I diastolic dysfunction (impaired relaxation). The average left ventricular global longitudinal strain is -26.7 %. The global longitudinal strain is normal. 2. Right ventricular systolic function is normal. The right ventricular size is normal. Tricuspid regurgitation signal is inadequate for assessing PA pressure. 3. The mitral valve is abnormal. Trivial mitral valve regurgitation. 4. The aortic valve is tricuspid. Aortic  valve regurgitation is not visualized. 5. The inferior vena cava is normal in size with greater than 50% respiratory variability, suggesting right atrial pressure of 3 mmHg.  Comparison(s): No prior Echocardiogram.  FINDINGS Left Ventricle: Left ventricular ejection fraction, by estimation, is 60 to 65%. The left ventricle has normal function. The left ventricle has no regional wall motion abnormalities. The average left ventricular global longitudinal strain is -26.7 %. The global longitudinal strain is normal. The left ventricular internal cavity size was normal in size. There is no left ventricular hypertrophy. Left ventricular diastolic parameters are consistent with Grade I diastolic dysfunction (impaired relaxation). Indeterminate filling pressures.  Right Ventricle: The right ventricular size is normal. No increase in right ventricular wall thickness. Right ventricular systolic function is normal. Tricuspid regurgitation signal is inadequate for assessing PA pressure.  Left Atrium: Left atrial size was normal in size.  Right Atrium: Right atrial size was normal in size.  Pericardium: There is no evidence of pericardial effusion.  Mitral Valve: The mitral valve is abnormal. There is mild thickening of the anterior and posterior mitral valve leaflet(s). Trivial mitral valve regurgitation.  Tricuspid Valve: The tricuspid valve is grossly normal. Tricuspid valve regurgitation is trivial.  Aortic Valve: The aortic valve is tricuspid. Aortic valve regurgitation is not visualized.  Pulmonic Valve: The pulmonic valve was grossly normal. Pulmonic valve regurgitation is mild.  Aorta: The aortic root and ascending aorta are structurally normal, with no evidence of dilitation.  Venous: The inferior vena cava is normal in size with greater than 50% respiratory variability, suggesting right atrial pressure of 3 mmHg.  IAS/Shunts: No atrial level shunt detected by color flow Doppler.   LEFT  VENTRICLE PLAX 2D LVIDd:         5.20 cm   Diastology LVIDs:         3.40 cm   LV e' medial:    6.20 cm/s LV PW:         0.80 cm   LV E/e' medial:  14.0 LV IVS:        0.80 cm   LV e' lateral:   7.04 cm/s LVOT diam:     2.00 cm   LV E/e' lateral: 12.4 LV SV:         64 LV SV Index:   29        2D Longitudinal Strain LVOT Area:     3.14 cm  2D Strain GLS (A2C):   -25.4 % 2D Strain GLS (A3C):   -28.3 % 2D Strain GLS (A4C):   -26.5 % 2D Strain GLS Avg:     -26.7 %  3D Volume EF: 3D EF:        57 % LV EDV:       167 ml LV ESV:       71 ml LV SV:        96 ml  RIGHT VENTRICLE  IVC RV Basal diam:  3.70 cm     IVC diam: 1.30 cm RV Mid diam:    2.80 cm RV S prime:     11.65 cm/s TAPSE (M-mode): 2.5 cm  LEFT ATRIUM           Index        RIGHT ATRIUM           Index LA diam:      3.90 cm 1.79 cm/m   RA Area:     10.50 cm LA Vol (A2C): 44.4 ml 20.39 ml/m  RA Volume:   23.10 ml  10.61 ml/m LA Vol (A4C): 55.2 ml 25.35 ml/m AORTIC VALVE LVOT Vmax:   105.00 cm/s LVOT Vmean:  68.600 cm/s LVOT VTI:    0.204 m  AORTA Ao Root diam: 3.00 cm Ao Asc diam:  3.40 cm  MITRAL VALVE MV Area (PHT): 3.21 cm    SHUNTS MV Decel Time: 236 msec    Systemic VTI:  0.20 m MV E velocity: 87.00 cm/s  Systemic Diam: 2.00 cm MV A velocity: 84.90 cm/s MV E/A ratio:  1.02  Dinah Franco MD Electronically signed by Dinah Franco MD Signature Date/Time: 09/27/2022/12:41:17 PM    Final          ______________________________________________________________________________________________       Current Reported Medications:.    Current Meds  Medication Sig   amLODipine (NORVASC) 5 MG tablet Take 1 tablet (5 mg total) by mouth daily.   labetalol (NORMODYNE) 100 MG tablet Take 1 tablet (100 mg total) by mouth 2 (two) times daily.   levothyroxine (SYNTHROID) 75 MCG tablet Take 1 tablet (75 mcg total) by mouth daily.   Ubrogepant (UBRELVY) 50 MG TABS Take 1 tablet (50 mg total)  by mouth daily as needed.    Physical Exam:    VS:  BP 118/72   Pulse 69   Ht 5\' 5"  (1.651 m)   Wt 205 lb 6.4 oz (93.2 kg)   LMP 02/19/2024 (Exact Date)   SpO2 99%   BMI 34.18 kg/m    Wt Readings from Last 3 Encounters:  03/13/24 205 lb 6.4 oz (93.2 kg)  02/07/24 205 lb (93 kg)  09/18/23 201 lb (91.2 kg)    GEN: Well nourished, well developed in no acute distress NECK: No JVD; No carotid bruits CARDIAC: RRR, no murmurs, rubs, gallops RESPIRATORY:  Clear to auscultation without rales, wheezing or rhonchi  ABDOMEN: Soft, non-tender, non-distended EXTREMITIES:  No edema; No acute deformity     Asessement and Plan:Brandi Wise    Hypertension: Blood pressure today 118/72.  Patient reports at home that her systolic blood pressures have varied between 130-140 with diastolics in the 90s.  She notes there better controlled however still high.  Patient agreeable to starting labetalol 100 mg twice daily.  She will continue to monitor her blood pressure at home and notify the office if consistently elevated above 130/80. Continue amlodipine 5 mg daily.  Palpitations: At last office visit patient noted a daily fluttering sensation in her chest lasting anywhere from 5 to 30 minutes.  She noted with longer episodes she would have some slight dizziness improved and she sat down.  Preliminary cardiac monitor results indicate an average heart rate of 81 bpm ranging from 50 to 183 bpm, predominant underlying rhythm was sinus rhythm, rare ectopy was noted. Today she reports she has not had any palpitations in the last week.  Reviewed preliminary monitor results with patient and reassured.  Prediabetes:  Last hemoglobin A1c 5.9 on 07/05/2023.  Monitor managed per PCP.  Patient continues to regularly exercise, is doing CrossFit regularly.   Disposition: F/u with Dr. Emmette Harms or Neeraj Housand, NP in three months or sooner if needed.   Signed, Merrill Villarruel D Melquan Ernsberger, NP

## 2024-03-13 ENCOUNTER — Encounter: Payer: Self-pay | Admitting: Cardiology

## 2024-03-13 ENCOUNTER — Ambulatory Visit: Attending: Cardiology | Admitting: Cardiology

## 2024-03-13 ENCOUNTER — Other Ambulatory Visit (HOSPITAL_COMMUNITY): Payer: Self-pay

## 2024-03-13 VITALS — BP 118/72 | HR 69 | Ht 65.0 in | Wt 205.4 lb

## 2024-03-13 DIAGNOSIS — R002 Palpitations: Secondary | ICD-10-CM

## 2024-03-13 DIAGNOSIS — O1003 Pre-existing essential hypertension complicating the puerperium: Secondary | ICD-10-CM

## 2024-03-13 DIAGNOSIS — O1493 Unspecified pre-eclampsia, third trimester: Secondary | ICD-10-CM

## 2024-03-13 DIAGNOSIS — Z8759 Personal history of other complications of pregnancy, childbirth and the puerperium: Secondary | ICD-10-CM | POA: Diagnosis not present

## 2024-03-13 DIAGNOSIS — R7303 Prediabetes: Secondary | ICD-10-CM

## 2024-03-13 DIAGNOSIS — I10 Essential (primary) hypertension: Secondary | ICD-10-CM | POA: Diagnosis not present

## 2024-03-13 MED ORDER — LABETALOL HCL 100 MG PO TABS
100.0000 mg | ORAL_TABLET | Freq: Two times a day (BID) | ORAL | 3 refills | Status: AC
  Filled 2024-03-13: qty 180, 90d supply, fill #0

## 2024-03-13 NOTE — Patient Instructions (Signed)
 Medication Instructions:  Start Labetalol 100 mg twice a day  *If you need a refill on your cardiac medications before your next appointment, please call your pharmacy*  Lab Work: No labs If you have labs (blood work) drawn today and your tests are completely normal, you will receive your results only by: MyChart Message (if you have MyChart) OR A paper copy in the mail If you have any lab test that is abnormal or we need to change your treatment, we will call you to review the results.  Testing/Procedures: No testing  Follow-Up: At [facility], you and your health needs are our priority.  As part of our continuing mission to provide you with exceptional heart care, our providers are all part of one team.  This team includes your primary Cardiologist (physician) and Advanced Practice Providers or APPs (Physician Assistants and Nurse Practitioners) who all work together to provide you with the care you need, when you need it.  Your next appointment:   3-4 month(s)  Provider:   Kardie Tobb, DO or Katlyn West, NP   We recommend signing up for the patient portal called "MyChart".  Sign up information is provided on this After Visit Summary.  MyChart is used to connect with patients for Virtual Visits (Telemedicine).  Patients are able to view lab/test results, encounter notes, upcoming appointments, etc.  Non-urgent messages can be sent to your provider as well.   To learn more about what you can do with MyChart, go to [email or web address].   Other Instructions Please take your blood pressure daily for 2 weeks and send in a MyChart message. Please include heart rates. (One message at the end of the 2 weeks).   HOW TO TAKE YOUR BLOOD PRESSURE: Rest 5 minutes before taking your blood pressure. Don't smoke or drink caffeinated beverages for at least 30 minutes before. Take your blood pressure before (not after) you eat. Sit comfortably with your back supported and both feet on  the floor (don't cross your legs). Elevate your arm to heart level on a table or a desk. Use the proper sized cuff. It should fit smoothly and snugly around your bare upper arm. There should be enough room to slip a fingertip under the cuff. The bottom edge of the cuff should be 1 inch above the crease of the elbow. Ideally, take 3 measurements at one sitting and record the average.    1st Floor: - Lobby - Registration  - Pharmacy  - Lab - Cafe  2nd Floor: - PV Lab - Diagnostic Testing (echo, CT, nuclear med)  3rd Floor: - Vacant  4th Floor: - TCTS (cardiothoracic surgery) - AFib Clinic - Structural Heart Clinic - Vascular Surgery  - Vascular Ultrasound  5th Floor: - [facility] (general and EP) - Clinical Pharmacy for coumadin, hypertension, lipid, weight-loss medications, and med management appointments    Valet parking services will be available as well.

## 2024-03-17 DIAGNOSIS — R002 Palpitations: Secondary | ICD-10-CM

## 2024-03-21 ENCOUNTER — Other Ambulatory Visit (HOSPITAL_COMMUNITY): Payer: Self-pay

## 2024-03-21 ENCOUNTER — Telehealth: Payer: Self-pay

## 2024-03-21 NOTE — Telephone Encounter (Signed)
 Pharmacy Patient Advocate Encounter   Received notification from CoverMyMeds that prior authorization for Ubrelvy  50MG  tablets is required/requested.   Insurance verification completed.   The patient is insured through [facility] .   Per test claim: PA required; PA submitted to above mentioned insurance via CoverMyMeds Key/confirmation #/EOC [id number] Status is pending

## 2024-03-23 ENCOUNTER — Other Ambulatory Visit (HOSPITAL_COMMUNITY): Payer: Self-pay

## 2024-04-17 NOTE — Telephone Encounter (Signed)
 Pharmacy Patient Advocate Encounter  Received notification from [employer] that Prior Authorization for Ubrelvy  50MG  tablets has been DENIED.  Full denial letter will be uploaded to the media tab. See denial reason below.   PA #/Case ID/Reference #: [id number]  I called insurance because the denial was so vague. Rep explained denial to me and stated it was denied because of a question regarding diagnosis of medication being requested an I answered "other" when it should have been listed as acute. I was able to resubmit a new prior auth. I will update once I get determination.

## 2024-05-02 ENCOUNTER — Other Ambulatory Visit (HOSPITAL_COMMUNITY): Payer: Self-pay

## 2024-05-02 NOTE — Telephone Encounter (Signed)
 Pharmacy Patient Advocate Encounter  Received notification from [facility] that Prior Authorization for Ubrelvy  50MG  tablets has been Approved for 16 tablets per 30 days until 07/18/2024

## 2024-05-23 ENCOUNTER — Ambulatory Visit (INDEPENDENT_AMBULATORY_CARE_PROVIDER_SITE_OTHER)

## 2024-05-23 ENCOUNTER — Ambulatory Visit
Admission: EM | Admit: 2024-05-23 | Discharge: 2024-05-23 | Disposition: A | Discharge status: Home / Self Care | Attending: Family Medicine | Admitting: Family Medicine

## 2024-05-23 DIAGNOSIS — R053 Chronic cough: Secondary | ICD-10-CM

## 2024-05-23 MED ORDER — PROMETHAZINE-DM 6.25-15 MG/5ML PO SYRP: 5.0000 mL | ORAL_SOLUTION | Freq: Three times a day (TID) | ORAL | 0 refills | Status: AC | PRN

## 2024-05-23 MED ORDER — PREDNISONE 10 MG PO TABS: 30.0000 mg | ORAL_TABLET | Freq: Every day | ORAL | 0 refills | Status: AC

## 2024-05-23 NOTE — ED Provider Notes (Signed)
 [facility]  Note:  This document was prepared using Conservation officer, historic buildings and may include unintentional dictation errors.  MRN: [id number] DOB: Mar 01, 1991  Subjective:   Brandi Wise is a 33 y.o. female presenting for 1 week history of a persistent lingering cough that elicits chest pain and chest tightness.  No shortness of breath, wheezing, fever, sinus congestion, throat pain, ear pain, nausea, vomiting, belly pain, rashes. No smoking of any kind including cigarettes, cigars, vaping, marijuana use.  No asthma.  No current facility-administered medications for this encounter.  Current Outpatient Medications:    acetaminophen  (TYLENOL ) 325 MG tablet, Take 2 tablets (650 mg total) by mouth every 6 (six) hours as needed for moderate pain (pain score 4-6). (Patient not taking: Reported on 02/07/2024), Disp: 30 tablet, Rfl: 0   amLODipine  (NORVASC ) 5 MG tablet, Take 1 tablet (5 mg total) by mouth daily., Disp: 90 tablet, Rfl: 3   cetirizine  (ZYRTEC  ALLERGY) 10 MG tablet, Take 1 tablet (10 mg total) by mouth daily. (Patient not taking: Reported on 02/07/2024), Disp: 30 tablet, Rfl: 0   ferrous sulfate  324 MG TBEC, Take 1 tablet (324 mg total) by mouth 2 (two) times daily. (Patient not taking: Reported on 02/07/2024), Disp: 60 tablet, Rfl: 11   folic acid  (FOLVITE ) 1 MG tablet, Take 1 tablet (1 mg total) by mouth daily. (Patient not taking: Reported on 02/07/2024), Disp: 90 tablet, Rfl: 2   ibuprofen  (ADVIL ) 600 MG tablet, Take 1 tablet (600 mg total) by mouth every 6 (six) hours. (Patient not taking: Reported on 02/07/2024), Disp: 30 tablet, Rfl: 11   labetalol  (NORMODYNE ) 100 MG tablet, Take 1 tablet (100 mg total) by mouth 2 (two) times daily., Disp: 180 tablet, Rfl: 3   levothyroxine  (SYNTHROID ) 75 MCG tablet, Take 1 tablet (75 mcg total) by mouth daily., Disp: 90 tablet, Rfl: 3   mineral oil liquid, 30 ml (Patient not taking: Reported on 02/07/2024), Disp: 180  mL, Rfl: 0   predniSONE  (DELTASONE ) 10 MG tablet, Take 3 tablets (30 mg total) by mouth daily with breakfast. (Patient not taking: Reported on 02/07/2024), Disp: 15 tablet, Rfl: 0   Prenatal Vit-Fe Fumarate-FA (MULTIVITAMIN-PRENATAL) 27-0.8 MG TABS tablet, Take 1 tablet by mouth daily at 12 noon. (Patient not taking: Reported on 02/07/2024), Disp: , Rfl:    promethazine -dextromethorphan (PROMETHAZINE -DM) 6.25-15 MG/5ML syrup, Take 5 mLs by mouth 3 (three) times daily as needed for cough. (Patient not taking: Reported on 02/07/2024), Disp: 200 mL, Rfl: 0   Ubrogepant  (UBRELVY ) 50 MG TABS, Take 1 tablet (50 mg total) by mouth daily as needed., Disp: 30 tablet, Rfl: 2   No Known Allergies  Past Medical History:  Diagnosis Date   Knee pain    Medical history non-contributory    Pre-diabetes    Preeclampsia in postpartum period 09/05/2022   Pregnancy induced hypertension      Past Surgical History:  Procedure Laterality Date   POLYPECTOMY     THYROIDECTOMY Left 05/19/2021   Procedure: THYROID  LOBECTOMY;  Surgeon: Carlie Clark, MD;  Location: [facility] OR;  Service: ENT;  Laterality: Left;    Family History  Adopted: Yes  Problem Relation Age of Onset   Drug abuse Mother    Healthy Father     Social History   Tobacco Use   Smoking status: Never   Smokeless tobacco: Never  Vaping Use   Vaping status: Never Used  Substance Use Topics   Alcohol use: Yes    Comment:  socially   Drug use: Never    ROS   Objective:   Vitals: BP (!) 135/93 (BP Location: Right Arm)   Pulse 64   Temp 98.7 F (37.1 C) (Oral)   Resp 16   LMP  (Within Weeks) Comment: 3 weeks  SpO2 98%   Breastfeeding No   Physical Exam Constitutional:      General: She is not in acute distress.    Appearance: Normal appearance. She is well-developed. She is not ill-appearing, toxic-appearing or diaphoretic.  HENT:     Head: Normocephalic and atraumatic.     Right Ear: External ear normal.     Left Ear: External  ear normal.     Nose: Nose normal.     Mouth/Throat:     Mouth: Mucous membranes are moist.   Eyes:     General: No scleral icterus.       Right eye: No discharge.        Left eye: No discharge.     Extraocular Movements: Extraocular movements intact.     Conjunctiva/sclera: Conjunctivae normal.     Pupils: Pupils are equal, round, and reactive to light.    Cardiovascular:     Rate and Rhythm: Normal rate and regular rhythm.     Heart sounds: Normal heart sounds. No murmur heard.    No friction rub. No gallop.  Pulmonary:     Effort: Pulmonary effort is normal. No respiratory distress.     Breath sounds: No stridor. No wheezing, rhonchi or rales.  Chest:     Chest wall: No tenderness.   Skin:    General: Skin is warm and dry.   Neurological:     General: No focal deficit present.     Mental Status: She is alert and oriented to person, place, and time.   Psychiatric:        Mood and Affect: Mood normal.        Behavior: Behavior normal.    DG Chest 2 View Result Date: 05/23/2024 CLINICAL DATA:  Persistent cough EXAM: CHEST - 2 VIEW COMPARISON:  None Available. FINDINGS: No consolidation, pneumothorax or effusion. No edema. Normal cardiopericardial silhouette. IMPRESSION: No acute cardiopulmonary disease. Electronically Signed   By: Ranell Bring M.D.   On: 05/23/2024 18:51    Assessment and Plan :   PDMP not reviewed this encounter.  1. Persistent cough    Patient would like a steroid course, has a really bothersome cough. Antibiotic stewardship discussed and she was agreeable. Will use prednisone  at 30mg  for 5 days, supportive care. Counseled patient on potential for adverse effects with medications prescribed/recommended today, ER and return-to-clinic precautions discussed, patient verbalized understanding.    Christopher Savannah, [place] 05/23/24 [id number]

## 2024-05-23 NOTE — ED Triage Notes (Signed)
 Pt reports cough x 1 week. Theraflu and Tylenol  cold gives no relief.

## 2024-06-12 NOTE — Progress Notes (Unsigned)
 Cardiology Office Note    Date:  06/13/2024  ID:  Brandi Wise, DOB [birth year], MRN [id number] PCP:  Rollene Almarie LABOR, MD  Cardiologist:  Dub Huntsman, DO  Electrophysiologist:  None   Chief Complaint: Follow up for hypertension and palpitations   History of Present Illness: .    Brandi Wise is a 33 y.o. female with visit-pertinent history of postpartum hypertension, prediabetes, preeclampsia and obesity.  First evaluated by Dr. Huntsman on 09/12/2022 after being admitted to [facility] postpartum day 2 with preeclampsia.  She had been discharged on amlodipine  2.5 mg daily, hydralazine  50 mg every 8 hours and labetalol  400 mg twice a day.  She was last seen in clinic on 12/16/2022, her amlodipine  had previously been increased to 5 mg daily.  Her hydralazine  was decreased to 25 mg twice a day and she was started on hydrochlorothiazide  12.5 mg daily.  She was continued on amlodipine  5 mg daily and and labetalol  400 mg twice daily.  It was recommended that she follow-up in 12 weeks.  Patient was seen in clinic on 02/07/2024.  She noted labile blood pressures at home, she reported that she had stopped taking amlodipine  and labetalol  in August.  Noted that she was regularly doing [employer] and would have spikes in blood pressure, she would become dizzy and had a slight headache.  She also reported a fluttering sensation in her chest with some mild shortness of breath and discomfort that quickly resolved.  Patient was last seen in clinic on 03/13/24. She was doing well at that time, reported her palpitations and chest discomfort have improved. She was completing Cross[employer] regularly. Her blood pressure remained elevated at home and was started on labetalol  100 mg twice daily.   Today she presents for follow up. She reports that she is doing very well, her palpitations have resolved with starting of labetalol . She denies chest pain, shorntess of breath, lower extremity edema, orthopnea or PND.   She denies any presyncope or syncope.  She continues regularly attending [employer], notes that she typically attends 5 times a week and has been tolerating very well.  ROS: .   Today she denies chest pain, shortness of breath, lower extremity edema, fatigue, palpitations, melena, hematuria, hemoptysis, diaphoresis, weakness, presyncope, syncope, orthopnea, and PND.  All other systems are reviewed and otherwise negative. Studies Reviewed: SABRA    EKG:  EKG is not ordered today.  CV Studies: Cardiac studies reviewed are outlined and summarized above. Otherwise please see EMR for full report. Cardiac Studies & Procedures   ______________________________________________________________________________________________     ECHOCARDIOGRAM  ECHOCARDIOGRAM COMPLETE 09/27/2022  Narrative ECHOCARDIOGRAM REPORT    Patient Name:   Brandi Wise Date of Exam: 09/27/2022 Medical Rec #:  [id number]      Height:       65.0 in Accession #:    [id number]     Weight:       250.6 lb Date of Birth:  27-Jun-1991      BSA:          2.177 m Patient Age:    31 years       BP:           100/76 mmHg Patient Gender: F              HR:           74 bpm. Exam Location:  [address]  Procedure: 2D Echo, 3D Echo, Cardiac Doppler, Color Doppler and Strain Analysis  Indications:  I10 Hypertension  History:        Patient has no prior history of Echocardiogram examinations. Risk Factors:Pre-diabetes.  Sonographer:    Carl Coma RDCS Referring Phys: [id number] KARDIE TOBB  IMPRESSIONS   1. Left ventricular ejection fraction, by estimation, is 60 to 65%. The left ventricle has normal function. The left ventricle has no regional wall motion abnormalities. Left ventricular diastolic parameters are consistent with Grade I diastolic dysfunction (impaired relaxation). The average left ventricular global longitudinal strain is -26.7 %. The global longitudinal strain is normal. 2. Right ventricular systolic  function is normal. The right ventricular size is normal. Tricuspid regurgitation signal is inadequate for assessing PA pressure. 3. The mitral valve is abnormal. Trivial mitral valve regurgitation. 4. The aortic valve is tricuspid. Aortic valve regurgitation is not visualized. 5. The inferior vena cava is normal in size with greater than 50% respiratory variability, suggesting right atrial pressure of 3 mmHg.  Comparison(s): No prior Echocardiogram.  FINDINGS Left Ventricle: Left ventricular ejection fraction, by estimation, is 60 to 65%. The left ventricle has normal function. The left ventricle has no regional wall motion abnormalities. The average left ventricular global longitudinal strain is -26.7 %. The global longitudinal strain is normal. The left ventricular internal cavity size was normal in size. There is no left ventricular hypertrophy. Left ventricular diastolic parameters are consistent with Grade I diastolic dysfunction (impaired relaxation). Indeterminate filling pressures.  Right Ventricle: The right ventricular size is normal. No increase in right ventricular wall thickness. Right ventricular systolic function is normal. Tricuspid regurgitation signal is inadequate for assessing PA pressure.  Left Atrium: Left atrial size was normal in size.  Right Atrium: Right atrial size was normal in size.  Pericardium: There is no evidence of pericardial effusion.  Mitral Valve: The mitral valve is abnormal. There is mild thickening of the anterior and posterior mitral valve leaflet(s). Trivial mitral valve regurgitation.  Tricuspid Valve: The tricuspid valve is grossly normal. Tricuspid valve regurgitation is trivial.  Aortic Valve: The aortic valve is tricuspid. Aortic valve regurgitation is not visualized.  Pulmonic Valve: The pulmonic valve was grossly normal. Pulmonic valve regurgitation is mild.  Aorta: The aortic root and ascending aorta are structurally normal, with no  evidence of dilitation.  Venous: The inferior vena cava is normal in size with greater than 50% respiratory variability, suggesting right atrial pressure of 3 mmHg.  IAS/Shunts: No atrial level shunt detected by color flow Doppler.   LEFT VENTRICLE PLAX 2D LVIDd:         5.20 cm   Diastology LVIDs:         3.40 cm   LV e' medial:    6.20 cm/s LV PW:         0.80 cm   LV E/e' medial:  14.0 LV IVS:        0.80 cm   LV e' lateral:   7.04 cm/s LVOT diam:     2.00 cm   LV E/e' lateral: 12.4 LV SV:         64 LV SV Index:   29        2D Longitudinal Strain LVOT Area:     3.14 cm  2D Strain GLS (A2C):   -25.4 % 2D Strain GLS (A3C):   -28.3 % 2D Strain GLS (A4C):   -26.5 % 2D Strain GLS Avg:     -26.7 %  3D Volume EF: 3D EF:        57 % LV EDV:  167 ml LV ESV:       71 ml LV SV:        96 ml  RIGHT VENTRICLE             IVC RV Basal diam:  3.70 cm     IVC diam: 1.30 cm RV Mid diam:    2.80 cm RV S prime:     11.65 cm/s TAPSE (M-mode): 2.5 cm  LEFT ATRIUM           Index        RIGHT ATRIUM           Index LA diam:      3.90 cm 1.79 cm/m   RA Area:     10.50 cm LA Vol (A2C): 44.4 ml 20.39 ml/m  RA Volume:   23.10 ml  10.61 ml/m LA Vol (A4C): 55.2 ml 25.35 ml/m AORTIC VALVE LVOT Vmax:   105.00 cm/s LVOT Vmean:  68.600 cm/s LVOT VTI:    0.204 m  AORTA Ao Root diam: 3.00 cm Ao Asc diam:  3.40 cm  MITRAL VALVE MV Area (PHT): 3.21 cm    SHUNTS MV Decel Time: 236 msec    Systemic VTI:  0.20 m MV E velocity: 87.00 cm/s  Systemic Diam: 2.00 cm MV A velocity: 84.90 cm/s MV E/A ratio:  1.02  Brandi Maxcy MD Electronically signed by Brandi Maxcy MD Signature Date/Time: 09/27/2022/12:41:17 PM    Final    MONITORS  LONG TERM MONITOR (3-14 DAYS) 03/06/2024  Narrative Patch Wear Time:  9 days and 3 hours (2025-03-17T21:40:23-0400 to 2025-03-27T01:32:42-0400)  Patient had a min HR of 52 bpm, max HR of 183 bpm, and avg HR of 81 bpm. Predominant underlying rhythm  was Sinus Rhythm. Isolated SVEs were rare (<1.0%), SVE Couplets were rare (<1.0%), and no SVE Triplets were present. Isolated VEs were rare (<1.0%), and no VE Couplets or VE Triplets were present.  Symptoms associated with sinus rhythm and rare premature atrial complex.  Conclusion: Rare asymptomatic premature atrial complex.       ______________________________________________________________________________________________       Current Reported Medications:.    Current Meds  Medication Sig   amLODipine  (NORVASC ) 5 MG tablet Take 1 tablet (5 mg total) by mouth daily.   labetalol  (NORMODYNE ) 100 MG tablet Take 1 tablet (100 mg total) by mouth 2 (two) times daily.   levothyroxine  (SYNTHROID ) 75 MCG tablet Take 1 tablet (75 mcg total) by mouth daily.   Ubrogepant  (UBRELVY ) 50 MG TABS Take 1 tablet (50 mg total) by mouth daily as needed.    Physical Exam:    VS:  BP 118/76   Pulse 72   Ht 5' 5 (1.651 m)   Wt 207 lb (93.9 kg)   LMP  (Within Weeks)   SpO2 99%   BMI 34.45 kg/m    Wt Readings from Last 3 Encounters:  06/13/24 207 lb (93.9 kg)  03/13/24 205 lb 6.4 oz (93.2 kg)  02/07/24 205 lb (93 kg)    GEN: Well nourished, well developed in no acute distress NECK: No JVD; No carotid bruits CARDIAC: RRR, no murmurs, rubs, gallops RESPIRATORY:  Clear to auscultation without rales, wheezing or rhonchi  ABDOMEN: Soft, non-tender, non-distended EXTREMITIES:  No edema; No acute deformity     Asessement and Plan:SABRA    Hypertension: Blood pressure today 118/76. At home has been around 125/80 after taking her medications, overall has been very well-controlled. Continue labetalol  100 mg twice daily and amlodipine   5 mg daily   Palpitations: Patient reports that with initiation of labetalol  her palpitations have completely resolved.  Continue labetalol  100 mg twice daily.  Prediabetes: Last hemoglobnin A1c 5.9 on 07/05/23. Monitored and managed per pcp.    Disposition: F/u with Dr.  Sheena in six months or sooner if needed.   Signed, Brandi Kemp D Sheilah Rayos, NP

## 2024-06-13 ENCOUNTER — Encounter: Payer: Self-pay | Admitting: Cardiology

## 2024-06-13 ENCOUNTER — Ambulatory Visit: Attending: Cardiology | Admitting: Cardiology

## 2024-06-13 VITALS — BP 118/76 | HR 72 | Ht 65.0 in | Wt 207.0 lb

## 2024-06-13 DIAGNOSIS — I10 Essential (primary) hypertension: Secondary | ICD-10-CM | POA: Diagnosis not present

## 2024-06-13 DIAGNOSIS — R7303 Prediabetes: Secondary | ICD-10-CM

## 2024-06-13 DIAGNOSIS — Z8759 Personal history of other complications of pregnancy, childbirth and the puerperium: Secondary | ICD-10-CM | POA: Diagnosis not present

## 2024-06-13 DIAGNOSIS — R002 Palpitations: Secondary | ICD-10-CM

## 2024-06-13 DIAGNOSIS — O1493 Unspecified pre-eclampsia, third trimester: Secondary | ICD-10-CM

## 2024-06-13 NOTE — Patient Instructions (Signed)
 Medication Instructions:  No changes *If you need a refill on your cardiac medications before your next appointment, please call your pharmacy*  Lab Work: No labs  Testing/Procedures: No testing  Follow-Up: At [facility], you and your health needs are our priority.  As part of our continuing mission to provide you with exceptional heart care, our providers are all part of one team.  This team includes your primary Cardiologist (physician) and Advanced Practice Providers or APPs (Physician Assistants and Nurse Practitioners) who all work together to provide you with the care you need, when you need it.  Your next appointment:   6 month(s)  Provider:   Kardie Tobb, DO    We recommend signing up for the patient portal called MyChart.  Sign up information is provided on this After Visit Summary.  MyChart is used to connect with patients for Virtual Visits (Telemedicine).  Patients are able to view lab/test results, encounter notes, upcoming appointments, etc.  Non-urgent messages can be sent to your provider as well.   To learn more about what you can do with MyChart, go to [email or web address].

## 2024-12-25 ENCOUNTER — Ambulatory Visit: Payer: Self-pay

## 2024-12-25 NOTE — Telephone Encounter (Signed)
 FYI Only or Action Required?: FYI only for provider: appointment scheduled on 1/29.  Patient was last seen in primary care on 09/18/2023 by Rollene Almarie LABOR, MD.  Called Nurse Triage reporting Headache.  Symptoms began several weeks ago.  Interventions attempted: OTC medications: Tylenol , ibuprofen .  Symptoms are: stable.  Triage Disposition: See Physician Within 24 Hours  Patient/caregiver understands and will follow disposition?: Yes  Reason for Triage: major headache, dizziness, muffling in ears, and nausea   Reason for Disposition  [1] MODERATE headache (e.g., interferes with normal activities) AND [2] present > 24 hours AND [3] unexplained  (Exceptions: Pain medicines not tried, typical migraine, or headache part of viral illness.)  Answer Assessment - Initial Assessment Questions 1. LOCATION: Where does it hurt?      forehead 2. ONSET: When did the headache start? (e.g., minutes, hours, days)      2 wks ago 3. PATTERN: Does the pain come and go, or has it been constant since it started?     intermittent 4. SEVERITY: How bad is the pain? and What does it keep you from doing?  (e.g., Scale 1-10; mild, moderate, or severe)     moderate 5. RECURRENT SYMPTOM: Have you ever had headaches before? If Yes, ask: When was the last time? and What happened that time?      Hx migranes 6. CAUSE: What do you think is causing the headache?     unknown 7. MIGRAINE: Have you been diagnosed with migraine headaches? If Yes, ask: Is this headache similar?      Hx migranes, not similar, also has muffled hearing  9. OTHER SYMPTOMS: Do you have any other symptoms? (e.g., fever, stiff neck, eye pain, sore throat, cold symptoms)     Mild dizziness 10. PREGNANCY: Is there any chance you are pregnant? When was your last menstrual period?       denies  Protocols used: Headache-A-AH

## 2024-12-26 ENCOUNTER — Ambulatory Visit: Admitting: Nurse Practitioner
